# Patient Record
Sex: Female | Born: 1975 | Race: White | Hispanic: No | Marital: Married | Smoking: Current every day smoker
Health system: Southern US, Community
[De-identification: ages and names within clinical notes are randomized; demographics above are authoritative.]

## PROBLEM LIST (undated history)

## (undated) DIAGNOSIS — K219 Gastro-esophageal reflux disease without esophagitis: Secondary | ICD-10-CM

## (undated) DIAGNOSIS — E7800 Pure hypercholesterolemia, unspecified: Secondary | ICD-10-CM

## (undated) DIAGNOSIS — F4310 Post-traumatic stress disorder, unspecified: Secondary | ICD-10-CM

## (undated) DIAGNOSIS — E119 Type 2 diabetes mellitus without complications: Secondary | ICD-10-CM

## (undated) HISTORY — PX: TONSILLECTOMY: SUR1361

## (undated) HISTORY — PX: OTHER SURGICAL HISTORY: SHX169

## (undated) HISTORY — PX: ABDOMINAL HYSTERECTOMY: SHX81

## (undated) HISTORY — PX: CHOLECYSTECTOMY: SHX55

---

## 2009-12-15 ENCOUNTER — Emergency Department (HOSPITAL_BASED_OUTPATIENT_CLINIC_OR_DEPARTMENT_OTHER): Admission: EM | Admit: 2009-12-15 | Discharge: 2009-12-15 | Payer: Self-pay | Admitting: Emergency Medicine

## 2010-01-23 ENCOUNTER — Emergency Department (HOSPITAL_BASED_OUTPATIENT_CLINIC_OR_DEPARTMENT_OTHER): Admission: EM | Admit: 2010-01-23 | Discharge: 2010-01-23 | Payer: Self-pay | Admitting: Emergency Medicine

## 2011-02-16 LAB — URINE CULTURE: Colony Count: 40000

## 2011-02-16 LAB — URINALYSIS, ROUTINE W REFLEX MICROSCOPIC
Leukocytes, UA: NEGATIVE
Nitrite: NEGATIVE
Specific Gravity, Urine: 1.005 (ref 1.005–1.030)
Urobilinogen, UA: 0.2 mg/dL (ref 0.0–1.0)
pH: 6.5 (ref 5.0–8.0)

## 2011-02-16 LAB — URINE MICROSCOPIC-ADD ON

## 2012-08-12 ENCOUNTER — Emergency Department (HOSPITAL_BASED_OUTPATIENT_CLINIC_OR_DEPARTMENT_OTHER)
Admission: EM | Admit: 2012-08-12 | Discharge: 2012-08-12 | Disposition: A | Discharge status: Home / Self Care | Payer: Self-pay | Attending: Emergency Medicine | Admitting: Emergency Medicine

## 2012-08-12 ENCOUNTER — Encounter (HOSPITAL_BASED_OUTPATIENT_CLINIC_OR_DEPARTMENT_OTHER): Payer: Self-pay | Admitting: *Deleted

## 2012-08-12 DIAGNOSIS — M542 Cervicalgia: Secondary | ICD-10-CM | POA: Insufficient documentation

## 2012-08-12 DIAGNOSIS — R531 Weakness: Secondary | ICD-10-CM

## 2012-08-12 DIAGNOSIS — F4310 Post-traumatic stress disorder, unspecified: Secondary | ICD-10-CM | POA: Insufficient documentation

## 2012-08-12 DIAGNOSIS — K219 Gastro-esophageal reflux disease without esophagitis: Secondary | ICD-10-CM | POA: Insufficient documentation

## 2012-08-12 DIAGNOSIS — F17200 Nicotine dependence, unspecified, uncomplicated: Secondary | ICD-10-CM | POA: Insufficient documentation

## 2012-08-12 HISTORY — DX: Post-traumatic stress disorder, unspecified: F43.10

## 2012-08-12 HISTORY — DX: Gastro-esophageal reflux disease without esophagitis: K21.9

## 2012-08-12 LAB — CBC WITH DIFFERENTIAL/PLATELET
Basophils Absolute: 0 10*3/uL (ref 0.0–0.1)
Eosinophils Relative: 4 % (ref 0–5)
HCT: 40.3 % (ref 36.0–46.0)
Lymphocytes Relative: 41 % (ref 12–46)
Lymphs Abs: 4 10*3/uL (ref 0.7–4.0)
MCV: 96.4 fL (ref 78.0–100.0)
Neutro Abs: 4.3 10*3/uL (ref 1.7–7.7)
Platelets: 282 10*3/uL (ref 150–400)
RBC: 4.18 MIL/uL (ref 3.87–5.11)
RDW: 12.6 % (ref 11.5–15.5)
WBC: 9.8 10*3/uL (ref 4.0–10.5)

## 2012-08-12 LAB — COMPREHENSIVE METABOLIC PANEL
ALT: 47 U/L — ABNORMAL HIGH (ref 0–35)
AST: 43 U/L — ABNORMAL HIGH (ref 0–37)
Alkaline Phosphatase: 109 U/L (ref 39–117)
CO2: 24 mEq/L (ref 19–32)
Calcium: 9.5 mg/dL (ref 8.4–10.5)
Chloride: 103 mEq/L (ref 96–112)
GFR calc Af Amer: >90 mL/min (ref >90)
GFR calc non Af Amer: >90 mL/min (ref >90)
Glucose, Bld: 132 mg/dL — ABNORMAL HIGH (ref 70–99)
Sodium: 139 mEq/L (ref 135–145)
Total Bilirubin: 0.3 mg/dL (ref 0.3–1.2)

## 2012-08-12 NOTE — ED Provider Notes (Signed)
History     CSN: [id number]  Arrival date & time 08/12/12  2114   First MD Initiated Contact with Patient 08/12/12 2155      Chief Complaint  Patient presents with  . Neck Pain    (Consider location/radiation/quality/duration/timing/severity/associated sxs/prior treatment) HPI Comments: Patient presents here complaining of weakness, fatigue for the past week.  She reports that she feels a sensation of "blood rushing from the left side of her neck into her head".  She feels light-headed and weak and has no energy.  She denies fevers or chills.  Had hysterectomy when she was 27.    Patient is a 36 y.o. female presenting with neck pain. The history is provided by the patient.  Neck Pain  This is a new problem. Episode onset: one week ago. The problem occurs constantly. The problem has been gradually worsening. The pain is associated with nothing. There has been no fever. The pain is present in the left side.    Past Medical History  Diagnosis Date  . PTSD (post-traumatic stress disorder)   . GERD (gastroesophageal reflux disease)     Past Surgical History  Procedure Date  . Abdominal hysterectomy   . Cholecystectomy   . Tonsillectomy     No family history on file.  History  Substance Use Topics  . Smoking status: Current Every Day Smoker -- 1.0 packs/day  . Smokeless tobacco: Not on file  . Alcohol Use: No    OB History    Grav Para Term Preterm Abortions TAB SAB Ect Mult Living                  Review of Systems  Constitutional: Positive for fatigue. Negative for fever and chills.  HENT: Positive for neck pain.   All other systems reviewed and are negative.    Allergies  Keflex; Morphine and related; Penicillins; and Trazodone and nefazodone  Home Medications   Current Outpatient Rx  Name Route Sig Dispense Refill  . ACETAMINOPHEN 500 MG PO TABS Oral Take 1,500 mg by mouth once as needed. For pain    . ESCITALOPRAM OXALATE 20 MG PO TABS Oral Take 20 mg  by mouth daily.    Marland Kitchen OVER THE COUNTER MEDICATION Oral Take 1 tablet by mouth at bedtime. Heartburn medication      BP 124/83  Pulse 72  Temp 98.1 F (36.7 C) (Oral)  Resp 20  SpO2 100%  Physical Exam  Nursing note and vitals reviewed. Constitutional: She is oriented to person, place, and time. She appears well-developed and well-nourished. No distress.  HENT:  Head: Normocephalic and atraumatic.  Neck: Normal range of motion. Neck supple.       No bruits are audible.  Cardiovascular: Normal rate and regular rhythm.  Exam reveals no gallop and no friction rub.   No murmur heard. Pulmonary/Chest: Effort normal and breath sounds normal. No respiratory distress. She has no wheezes.  Abdominal: Soft. Bowel sounds are normal. She exhibits no distension. There is no tenderness.  Musculoskeletal: Normal range of motion.  Neurological: She is alert and oriented to person, place, and time.  Skin: Skin is warm and dry. She is not diaphoretic.    ED Course  Procedures (including critical care time)   Labs Reviewed  CBC WITH DIFFERENTIAL  COMPREHENSIVE METABOLIC PANEL   No results found.   No diagnosis found.    MDM  The patient presents here with a one week history of fatigue, having a sensation of blood  rushing up the left side of her neck into her head.  I am unable to find anything concerning on physical exam.  There are no bruits and neurologically, the exam is non-focal.  I doubt there is a stenosis or lesion in the carotid artery.  The cbc and cmp are unremarkable ruling out anemia or electrolyte abnormality.  I am unsure as to what the cause of her symptoms, however it does not appear to be emergent.  I believe she is stable for discharge, to give time, and follow up or return if her symptoms worsen.        Nfl./c  Geoffery Lyons, MD 08/12/12 [phone]

## 2012-08-12 NOTE — ED Notes (Signed)
Knot on her left neck for 3 days.  has a sensation of blood rushing to her head that makes her lightheaded when she moves her eyes. Has started exercising recently to decrease her cholesterol.

## 2013-05-08 ENCOUNTER — Encounter (HOSPITAL_BASED_OUTPATIENT_CLINIC_OR_DEPARTMENT_OTHER): Payer: Self-pay

## 2013-05-08 ENCOUNTER — Emergency Department (HOSPITAL_BASED_OUTPATIENT_CLINIC_OR_DEPARTMENT_OTHER)
Admission: EM | Admit: 2013-05-08 | Discharge: 2013-05-08 | Disposition: A | Discharge status: Home / Self Care | Payer: Medicaid Other | Attending: Emergency Medicine | Admitting: Emergency Medicine

## 2013-05-08 DIAGNOSIS — F17200 Nicotine dependence, unspecified, uncomplicated: Secondary | ICD-10-CM | POA: Insufficient documentation

## 2013-05-08 DIAGNOSIS — Z880 Allergy status to penicillin: Secondary | ICD-10-CM | POA: Insufficient documentation

## 2013-05-08 DIAGNOSIS — F4310 Post-traumatic stress disorder, unspecified: Secondary | ICD-10-CM | POA: Insufficient documentation

## 2013-05-08 DIAGNOSIS — E7800 Pure hypercholesterolemia, unspecified: Secondary | ICD-10-CM | POA: Insufficient documentation

## 2013-05-08 DIAGNOSIS — L600 Ingrowing nail: Secondary | ICD-10-CM

## 2013-05-08 DIAGNOSIS — Z79899 Other long term (current) drug therapy: Secondary | ICD-10-CM | POA: Insufficient documentation

## 2013-05-08 DIAGNOSIS — Z8719 Personal history of other diseases of the digestive system: Secondary | ICD-10-CM | POA: Insufficient documentation

## 2013-05-08 HISTORY — DX: Pure hypercholesterolemia, unspecified: E78.00

## 2013-05-08 MED ORDER — SULFAMETHOXAZOLE-TRIMETHOPRIM 800-160 MG PO TABS: 1.0000 | ORAL_TABLET | Freq: Two times a day (BID) | ORAL | Status: DC

## 2013-05-08 NOTE — Discharge Instructions (Signed)
Infected Ingrown Toenail  An infected ingrown toenail occurs when the nail edge grows into the skin and bacteria invade the area. Symptoms include pain, tenderness, swelling, and pus drainage from the edge of the nail. Poorly fitting shoes, minor injuries, and improper cutting of the toenail may also contribute to the problem. You should cut your toenails squarely instead of rounding the edges. Do not cut them too short. Avoid tight or pointed toe shoes. Sometimes the ingrown portion of the nail must be removed. If your toenail is removed, it can take 3-4 months for it to re-grow.  HOME CARE INSTRUCTIONS    Soak your infected toe in warm water for 20-30 minutes, 2 to 3 times a day.   Packing or dressings applied to the area should be changed daily.   Take medicine as directed and finish them.   Reduce activities and keep your foot elevated when able to reduce swelling and discomfort. Do this until the infection gets better.   Wear sandals or go barefoot as much as possible while the infected area is sensitive.   See your caregiver for follow-up care in 2-3 days if the infection is not better.  SEEK MEDICAL CARE IF:   Your toe is becoming more red, swollen or painful.  MAKE SURE YOU:    Understand these instructions.   Will watch your condition.   Will get help right away if you are not doing well or get worse.  Document Released: 12/25/2004 Document Revised: 02/09/2012 Document Reviewed: 11/13/2008  [employer] Patient Information 2014 [employer]

## 2013-05-08 NOTE — ED Notes (Signed)
Foot great toe pain with redness and edema no purulent drainage noted. Pt Reports pain 4 /10 that increase with ambulation or touch.

## 2013-05-08 NOTE — ED Provider Notes (Signed)
Medical screening examination/treatment/procedure(s) were performed by non-physician practitioner and as supervising physician I was immediately available for consultation/collaboration.  Jodine Muchmore J. Stehanie Ekstrom, MD 05/08/13 2252 

## 2013-05-08 NOTE — ED Notes (Signed)
Patient complains of pain to right great toe since Friday. Reports that she tried to remove ingrown nail and now red and swelling with pain to same, no drainage

## 2013-05-08 NOTE — ED Provider Notes (Signed)
History     CSN: [id number]  Arrival date & time 05/08/13  1857   First MD Initiated Contact with Patient 05/08/13 1945      Chief Complaint  Patient presents with  . Toe Pain    (Consider location/radiation/quality/duration/timing/severity/associated sxs/prior treatment) Patient is a 37 y.o. female presenting with toe pain. The history is provided by the patient. No language interpreter was used.  Toe Pain This is a new problem. The current episode started in the past 7 days. The problem occurs constantly. The problem has been unchanged. The symptoms are aggravated by walking. She has tried nothing for the symptoms. The treatment provided moderate relief.  Pt complains of an ingrown toenail .  Pt reports she tried to cut area out.   Pt reports it looks like nail is infected  Past Medical History  Diagnosis Date  . PTSD (post-traumatic stress disorder)   . GERD (gastroesophageal reflux disease)   . High cholesterol     Past Surgical History  Procedure Laterality Date  . Abdominal hysterectomy    . Cholecystectomy    . Tonsillectomy      No family history on file.  History  Substance Use Topics  . Smoking status: Current Every Day Smoker -- 1.00 packs/day  . Smokeless tobacco: Not on file  . Alcohol Use: No    OB History   Grav Para Term Preterm Abortions TAB SAB Ect Mult Living                  Review of Systems  Skin: Positive for wound.  All other systems reviewed and are negative.    Allergies  Keflex; Morphine and related; Penicillins; and Trazodone and nefazodone  Home Medications   Current Outpatient Rx  Name  Route  Sig  Dispense  Refill  . atorvastatin (LIPITOR) 20 MG tablet   Oral   Take 20 mg by mouth daily.         Marland Kitchen lamoTRIgine (LAMICTAL) 100 MG tablet   Oral   Take 100 mg by mouth daily.         Marland Kitchen acetaminophen (TYLENOL) 500 MG tablet   Oral   Take 1,500 mg by mouth once as needed. For pain         . escitalopram (LEXAPRO) 20  MG tablet   Oral   Take 10 mg by mouth daily.          Marland Kitchen OVER THE COUNTER MEDICATION   Oral   Take 1 tablet by mouth at bedtime. Heartburn medication           BP 132/85  Pulse 81  Temp(Src) 97.9 F (36.6 C) (Oral)  Resp 18  SpO2 100%  Physical Exam  Nursing note and vitals reviewed. Constitutional: She appears well-developed and well-nourished.  HENT:  Head: Normocephalic.  Musculoskeletal: She exhibits tenderness.  Swollen distal toe past nail,  Nail cut into lateral tissue  Neurological: She is alert.  Skin: Skin is warm.  Psychiatric: She has a normal mood and affect.    ED Course  Procedures (including critical care time)  Labs Reviewed - No data to display No results found.   1. Ingrown toenail       MDM  Pt given rx for bactrim.    I advised follow up with Dr. Charlsie Merles if not improving with antibiotic        Elson Areas, PA-C 05/08/13 2043

## 2013-11-03 ENCOUNTER — Emergency Department (HOSPITAL_BASED_OUTPATIENT_CLINIC_OR_DEPARTMENT_OTHER): Payer: Medicaid Other

## 2013-11-03 ENCOUNTER — Encounter (HOSPITAL_BASED_OUTPATIENT_CLINIC_OR_DEPARTMENT_OTHER): Payer: Self-pay | Admitting: Emergency Medicine

## 2013-11-03 ENCOUNTER — Emergency Department (HOSPITAL_BASED_OUTPATIENT_CLINIC_OR_DEPARTMENT_OTHER)
Admission: EM | Admit: 2013-11-03 | Discharge: 2013-11-04 | Disposition: A | Discharge status: Home / Self Care | Payer: Medicaid Other | Attending: Emergency Medicine | Admitting: Emergency Medicine

## 2013-11-03 DIAGNOSIS — R079 Chest pain, unspecified: Secondary | ICD-10-CM | POA: Insufficient documentation

## 2013-11-03 DIAGNOSIS — Z792 Long term (current) use of antibiotics: Secondary | ICD-10-CM | POA: Insufficient documentation

## 2013-11-03 DIAGNOSIS — Z8719 Personal history of other diseases of the digestive system: Secondary | ICD-10-CM | POA: Insufficient documentation

## 2013-11-03 DIAGNOSIS — E7800 Pure hypercholesterolemia, unspecified: Secondary | ICD-10-CM | POA: Insufficient documentation

## 2013-11-03 DIAGNOSIS — F4310 Post-traumatic stress disorder, unspecified: Secondary | ICD-10-CM | POA: Insufficient documentation

## 2013-11-03 DIAGNOSIS — Z79899 Other long term (current) drug therapy: Secondary | ICD-10-CM | POA: Insufficient documentation

## 2013-11-03 DIAGNOSIS — F17200 Nicotine dependence, unspecified, uncomplicated: Secondary | ICD-10-CM | POA: Insufficient documentation

## 2013-11-03 DIAGNOSIS — E119 Type 2 diabetes mellitus without complications: Secondary | ICD-10-CM | POA: Insufficient documentation

## 2013-11-03 DIAGNOSIS — R002 Palpitations: Secondary | ICD-10-CM | POA: Insufficient documentation

## 2013-11-03 DIAGNOSIS — Z880 Allergy status to penicillin: Secondary | ICD-10-CM | POA: Insufficient documentation

## 2013-11-03 HISTORY — DX: Type 2 diabetes mellitus without complications: E11.9

## 2013-11-03 LAB — BASIC METABOLIC PANEL
BUN: 6 mg/dL (ref 6–23)
Chloride: 101 mEq/L (ref 96–112)
GFR calc Af Amer: >90 mL/min (ref >90)
GFR calc non Af Amer: >90 mL/min (ref >90)
Potassium: 3.4 mEq/L — ABNORMAL LOW (ref 3.5–5.1)
Sodium: 136 mEq/L (ref 135–145)

## 2013-11-03 LAB — CBC WITH DIFFERENTIAL/PLATELET
Basophils Absolute: 0 10*3/uL (ref 0.0–0.1)
Basophils Relative: 0 % (ref 0–1)
Eosinophils Absolute: 0.2 10*3/uL (ref 0.0–0.7)
MCH: 33.3 pg (ref 26.0–34.0)
MCHC: 35 g/dL (ref 30.0–36.0)
Monocytes Relative: 8 % (ref 3–12)
Neutro Abs: 7.8 10*3/uL — ABNORMAL HIGH (ref 1.7–7.7)
Neutrophils Relative %: 63 % (ref 43–77)
Platelets: 271 10*3/uL (ref 150–400)
RDW: 12.6 % (ref 11.5–15.5)

## 2013-11-03 LAB — TROPONIN I: Troponin I: <0.3 ng/mL (ref <0.30)

## 2013-11-03 MED ORDER — SODIUM CHLORIDE 0.9 % IV BOLUS (SEPSIS)
1000.0000 mL | Freq: Once | INTRAVENOUS | Status: AC
  Administered 2013-11-04: 1000 mL via INTRAVENOUS

## 2013-11-03 MED ORDER — IOHEXOL 350 MG/ML SOLN
80.0000 mL | Freq: Once | INTRAVENOUS | Status: AC | PRN
  Administered 2013-11-03: 80 mL via INTRAVENOUS

## 2013-11-03 NOTE — ED Provider Notes (Signed)
CSN: [id number]     Arrival date & time 11/03/13  2015 History   First MD Initiated Contact with Patient 11/03/13 2053     Chief Complaint  Patient presents with  . Palpitations   (Consider location/radiation/quality/duration/timing/severity/associated sxs/prior Treatment) HPI Comments: Pt states that she feels like her heart is racing and she is sob:pt states that it came on acutely about 3 hours ago when sitting in class:pt states that she feels a little tightness with the racing, but no pain:denies fever, cough:pt states that she has no history of similar symptoms:pt states that she has had 6 episode of diarrhea today  The history is provided by the patient. No language interpreter was used.    Past Medical History  Diagnosis Date  . PTSD (post-traumatic stress disorder)   . GERD (gastroesophageal reflux disease)   . High cholesterol   . Diabetes mellitus without complication    Past Surgical History  Procedure Laterality Date  . Abdominal hysterectomy    . Cholecystectomy    . Tonsillectomy     No family history on file. History  Substance Use Topics  . Smoking status: Current Every Day Smoker -- 1.00 packs/day  . Smokeless tobacco: Not on file  . Alcohol Use: No   OB History   Grav Para Term Preterm Abortions TAB SAB Ect Mult Living                 Review of Systems  Constitutional: Negative.   Respiratory: Negative.   Cardiovascular: Negative.     Allergies  Keflex; Morphine and related; Penicillins; and Trazodone and nefazodone  Home Medications   Current Outpatient Rx  Name  Route  Sig  Dispense  Refill  . metFORMIN (GLUCOPHAGE) 500 MG tablet   Oral   Take by mouth 2 (two) times daily with a meal.         . acetaminophen (TYLENOL) 500 MG tablet   Oral   Take 1,500 mg by mouth once as needed. For pain         . atorvastatin (LIPITOR) 20 MG tablet   Oral   Take 20 mg by mouth daily.         Marland Kitchen escitalopram (LEXAPRO) 20 MG tablet   Oral    Take 10 mg by mouth daily.          Marland Kitchen lamoTRIgine (LAMICTAL) 100 MG tablet   Oral   Take 100 mg by mouth daily.         Marland Kitchen OVER THE COUNTER MEDICATION   Oral   Take 1 tablet by mouth at bedtime. Heartburn medication         . sulfamethoxazole-trimethoprim (SEPTRA DS) 800-160 MG per tablet   Oral   Take 1 tablet by mouth every 12 (twelve) hours.   20 tablet   0    BP 134/87  Pulse 110  Temp(Src) 99 F (37.2 C) (Oral)  Resp 22  Wt 216 lb (97.977 kg)  SpO2 100% Physical Exam  Nursing note and vitals reviewed. Constitutional: She is oriented to person, place, and time. She appears well-developed and well-nourished.  HENT:  Head: Normocephalic and atraumatic.  Cardiovascular: Normal rate and regular rhythm.   Pulmonary/Chest: Effort normal and breath sounds normal.  Abdominal: Soft. Bowel sounds are normal.  Musculoskeletal: Normal range of motion.  Neurological: She is alert and oriented to person, place, and time. No cranial nerve deficit.  Skin: Skin is warm and dry.  Psychiatric: She has a normal  mood and affect.    ED Course  Procedures (including critical care time) Labs Review Labs Reviewed  CBC WITH DIFFERENTIAL - Abnormal; Notable for the following:    WBC 12.5 (*)    Neutro Abs 7.8 (*)    All other components within normal limits  BASIC METABOLIC PANEL - Abnormal; Notable for the following:    Potassium 3.4 (*)    Glucose, Bld 209 (*)    All other components within normal limits  D-DIMER, QUANTITATIVE - Abnormal; Notable for the following:    D-Dimer, Quant 0.82 (*)    All other components within normal limits  TROPONIN I   Imaging Review Dg Chest 2 View  11/03/2013   CLINICAL DATA:  Palpitations and chest tightness  EXAM: CHEST  2 VIEW  COMPARISON:  January 02, 2010  FINDINGS: The heart size and mediastinal contours are within normal limits. Both lungs are clear. The visualized skeletal structures are unremarkable. Surgical clips are identified in  the right upper quadrant.  IMPRESSION: No active cardiopulmonary disease.   Electronically Signed   By: Sherian Rein M.D.   On: 11/03/2013 21:45   Ct Angio Chest Pe W/cm &/or Wo Cm  11/03/2013   CLINICAL DATA:  Chest pain.  Cough.  Tachycardia.  Elevated D-dimer.  EXAM: CT ANGIOGRAPHY CHEST WITH CONTRAST  TECHNIQUE: Multidetector CT imaging of the chest was performed using the standard protocol during bolus administration of intravenous contrast. Multiplanar CT image reconstructions including MIPs were obtained to evaluate the vascular anatomy.  CONTRAST:  80mL OMNIPAQUE IOHEXOL 350 MG/ML SOLN  COMPARISON:  Plain film earlier in the day.  No prior CT.  FINDINGS: Lung windows demonstrate mild motion degradation throughout. Exam also degraded by patient body habitus. No airspace disease identified.  Soft tissue windows: The quality of this exam for evaluation of pulmonary embolism is moderate to good. The bolus is well timed. No evidence of pulmonary embolism to the large segmental level. Smaller emboli cannot be excluded.  Normal aortic caliber without dissection. Heart size upper normal, without pericardial effusion. No pleural fluid. No mediastinal or hilar adenopathy.  Limited abdominal imaging demonstrates moderate hepatic steatosis. No acute osseous abnormality.  Review of the MIP images confirms the above findings.  IMPRESSION: 1. Degraded exam secondary to motion and patient body habitus. No pulmonary embolism to the large segmental level. 2. No other explanation for patient's symptoms. 3. Hepatic steatosis.   Electronically Signed   By: Jeronimo Greaves M.D.   On: 11/03/2013 22:30    EKG Interpretation   None       MDM   1. Palpitations   2. Chest pain    Pt left with Dr. Elesa Massed for ct follow up    Teressa Lower, NP 11/04/13 1208

## 2013-11-03 NOTE — ED Notes (Signed)
Patient transported to X-ray and ct ambulatory with tech. 

## 2013-11-03 NOTE — ED Notes (Signed)
States she is having palpitations and a panic like feeling. States she cannot get herself together. Started while she was in class.

## 2013-11-03 NOTE — ED Provider Notes (Signed)
Medical screening examination/treatment/procedure(s) were conducted as a shared visit with non-physician practitioner(s) and myself.  I personally evaluated the patient during the encounter.   Date: 11/03/2013  Rate: 103  Rhythm: sinus tachycardia  QRS Axis: normal  Intervals: normal  ST/T Wave abnormalities: normal  Conduction Disutrbances: none  Narrative Interpretation: unremarkable; nonspecific ST flattening in inferior leads, no old for comparison   Patient is a 37 year old female with a history of diabetes, hyperlipidemia, tobacco use, family history of CAD who presents the emergency department with an episode of palpitations, chest pressure and shortness of breath and dizziness that started at 6 PM tonight. Patient denies any prior similar symptoms. No prior history of PE or DVT but states her father does have a history of factor V Leiden deficiency. She has had diarrhea but no other recent fever, cough, vomiting. Labs show mild leukocytosis but negative troponin. D-dimer is slightly elevated. CT chest pending to rule out PE.   Patient reports feeling better without intervention. Her CT chest shows no pulmonary embolus, infiltrate or edema. She states her chest pressure is gone. We'll repeat second set of cardiac labs at 6 hours after onset of symptoms given patient has multiple risk factors for ACS. Patient is now stating that she believes she may have actually taken her daughter's Vyvanse with her medications today.  If workup is negative and patient is still feeling well, anticipate discharge home with close outpatient followup.  Signed out to Dr. Bernette Mayers to follow up on second troponin.     Layla Maw Carrisa Keller, DO 11/04/13 [phone]

## 2013-11-04 LAB — TROPONIN I: Troponin I: <0.3 ng/mL (ref <0.30)

## 2013-11-04 LAB — RAPID URINE DRUG SCREEN, HOSP PERFORMED
Amphetamines: POSITIVE — AB
Opiates: NOT DETECTED
Tetrahydrocannabinol: NOT DETECTED

## 2013-11-04 NOTE — ED Provider Notes (Signed)
Care assumed at the change of shift pending repeat Trop which is neg. Pt is feeling better. Some concern for inadvertently taking daughter's Vyvanse and UDS is positive for Amphetamines. Doubt this is PE although patient aware of suboptimal CTA. Advised PCP followup if symptoms persist.   Leonette Most B. Bernette Mayers, MD 11/04/13 [phone]

## 2014-06-29 ENCOUNTER — Encounter (HOSPITAL_BASED_OUTPATIENT_CLINIC_OR_DEPARTMENT_OTHER): Payer: Self-pay | Admitting: Emergency Medicine

## 2014-06-29 ENCOUNTER — Emergency Department (HOSPITAL_BASED_OUTPATIENT_CLINIC_OR_DEPARTMENT_OTHER): Payer: Medicaid Other

## 2014-06-29 ENCOUNTER — Emergency Department (HOSPITAL_BASED_OUTPATIENT_CLINIC_OR_DEPARTMENT_OTHER)
Admission: EM | Admit: 2014-06-29 | Discharge: 2014-06-29 | Disposition: A | Discharge status: Home / Self Care | Payer: Medicaid Other | Attending: Emergency Medicine | Admitting: Emergency Medicine

## 2014-06-29 DIAGNOSIS — F4310 Post-traumatic stress disorder, unspecified: Secondary | ICD-10-CM | POA: Insufficient documentation

## 2014-06-29 DIAGNOSIS — Z880 Allergy status to penicillin: Secondary | ICD-10-CM | POA: Diagnosis not present

## 2014-06-29 DIAGNOSIS — Z792 Long term (current) use of antibiotics: Secondary | ICD-10-CM | POA: Insufficient documentation

## 2014-06-29 DIAGNOSIS — F17200 Nicotine dependence, unspecified, uncomplicated: Secondary | ICD-10-CM | POA: Insufficient documentation

## 2014-06-29 DIAGNOSIS — J40 Bronchitis, not specified as acute or chronic: Secondary | ICD-10-CM | POA: Diagnosis not present

## 2014-06-29 DIAGNOSIS — E7800 Pure hypercholesterolemia, unspecified: Secondary | ICD-10-CM | POA: Diagnosis not present

## 2014-06-29 DIAGNOSIS — Z79899 Other long term (current) drug therapy: Secondary | ICD-10-CM | POA: Diagnosis not present

## 2014-06-29 DIAGNOSIS — E119 Type 2 diabetes mellitus without complications: Secondary | ICD-10-CM | POA: Diagnosis not present

## 2014-06-29 DIAGNOSIS — R05 Cough: Secondary | ICD-10-CM | POA: Insufficient documentation

## 2014-06-29 DIAGNOSIS — R059 Cough, unspecified: Secondary | ICD-10-CM | POA: Insufficient documentation

## 2014-06-29 DIAGNOSIS — Z8719 Personal history of other diseases of the digestive system: Secondary | ICD-10-CM | POA: Insufficient documentation

## 2014-06-29 DIAGNOSIS — J209 Acute bronchitis, unspecified: Secondary | ICD-10-CM

## 2014-06-29 LAB — CBG MONITORING, ED: Glucose-Capillary: 158 mg/dL — ABNORMAL HIGH (ref 70–99)

## 2014-06-29 MED ORDER — ALBUTEROL SULFATE (2.5 MG/3ML) 0.083% IN NEBU
5.0000 mg | INHALATION_SOLUTION | Freq: Once | RESPIRATORY_TRACT | Status: AC
  Administered 2014-06-29: 5 mg via RESPIRATORY_TRACT
  Filled 2014-06-29: qty 6

## 2014-06-29 MED ORDER — PREDNISONE 10 MG PO TABS: 20.0000 mg | ORAL_TABLET | Freq: Every day | ORAL | Status: AC

## 2014-06-29 MED ORDER — PREDNISONE 50 MG PO TABS
60.0000 mg | ORAL_TABLET | Freq: Once | ORAL | Status: AC
  Administered 2014-06-29: 60 mg via ORAL
  Filled 2014-06-29 (×2): qty 1

## 2014-06-29 MED ORDER — ALBUTEROL SULFATE HFA 108 (90 BASE) MCG/ACT IN AERS
2.0000 | INHALATION_SPRAY | RESPIRATORY_TRACT | Status: DC | PRN
  Administered 2014-06-29: 2 via RESPIRATORY_TRACT
  Filled 2014-06-29: qty 6.7

## 2014-06-29 MED ORDER — IPRATROPIUM BROMIDE 0.02 % IN SOLN
0.5000 mg | Freq: Once | RESPIRATORY_TRACT | Status: AC
  Administered 2014-06-29: 0.5 mg via RESPIRATORY_TRACT
  Filled 2014-06-29: qty 2.5

## 2014-06-29 NOTE — ED Provider Notes (Signed)
CSN: [id number]     Arrival date & time 06/29/14  2020 History  This chart was scribed for Hilario Quarry, MD by Chestine Spore, ED Scribe. The patient was seen in room MH06/MH06 at 9:38 PM.    Chief Complaint  Patient presents with  . Cough     Patient is a 38 y.o. female presenting with cough. The history is provided by the patient. No language interpreter was used.  Cough Cough characteristics:  Non-productive Severity:  Moderate Onset quality:  Sudden Duration:  3 days Timing:  Constant Progression:  Unchanged Chronicity:  New Smoker: yes   Relieved by:  None tried Worsened by:  Nothing tried Ineffective treatments:  None tried Associated symptoms: chills, diaphoresis, fever and shortness of breath      HPI Comments: Breanna Norton is a 38 y.o. female with a medical hx of DM who presents to the Emergency Department complaining of non-productive cough onset 4 days ago. She states that she had a fever, diaphoresis, and chills 4 days ago. She states that she began to feel better yesterday. She states that she is having associated symptoms of SOB. She denies any other associated symptoms. She states that she is a smoker and she smokes 1 Pack a day. She states that she tries to drink fluids like she should but she sometimes does not want too. She states that he had a hysterectomy. She states that she has taken Metformin for her DM. [facility] is where her PCP is located. She states that the last time she had a cigarette was on the way here and that she could not finish it.    Past Medical History  Diagnosis Date  . PTSD (post-traumatic stress disorder)   . GERD (gastroesophageal reflux disease)   . High cholesterol   . Diabetes mellitus without complication    Past Surgical History  Procedure Laterality Date  . Abdominal hysterectomy    . Cholecystectomy    . Tonsillectomy     No family history on file. History  Substance Use Topics  . Smoking status:  Current Every Day Smoker -- 1.00 packs/day  . Smokeless tobacco: Not on file  . Alcohol Use: No   OB History   Grav Para Term Preterm Abortions TAB SAB Ect Mult Living                 Review of Systems  Constitutional: Positive for fever, chills and diaphoresis.  Respiratory: Positive for cough and shortness of breath.   All other systems reviewed and are negative.     Allergies  Keflex; Morphine and related; Penicillins; and Trazodone and nefazodone  Home Medications   Prior to Admission medications   Medication Sig Start Date End Date Taking? Authorizing Provider  ezetimibe (ZETIA) 10 MG tablet Take 10 mg by mouth daily.   Yes Historical Provider, MD  acetaminophen (TYLENOL) 500 MG tablet Take 1,500 mg by mouth once as needed. For pain    Historical Provider, MD  atorvastatin (LIPITOR) 20 MG tablet Take 20 mg by mouth daily.    Historical Provider, MD  escitalopram (LEXAPRO) 20 MG tablet Take 10 mg by mouth daily.     Historical Provider, MD  lamoTRIgine (LAMICTAL) 100 MG tablet Take 100 mg by mouth daily.    Historical Provider, MD  metFORMIN (GLUCOPHAGE) 500 MG tablet Take by mouth 2 (two) times daily with a meal.    Historical Provider, MD  OVER THE COUNTER MEDICATION Take 1 tablet by  mouth at bedtime. Heartburn medication    Historical Provider, MD  sulfamethoxazole-trimethoprim (SEPTRA DS) 800-160 MG per tablet Take 1 tablet by mouth every 12 (twelve) hours. 05/08/13   Elson AreasLeslie K Sofia, PA-C   BP 123/98  Pulse 103  Temp(Src) 99.5 F (37.5 C) (Oral)  Resp 20  Ht 5' 5.5" (1.664 m)  Wt 232 lb (105.235 kg)  BMI 38.01 kg/m2  SpO2 97%  Physical Exam  Nursing note and vitals reviewed. Constitutional: She is oriented to person, place, and time. She appears well-developed and well-nourished.  HENT:  Head: Normocephalic and atraumatic.  Right Ear: Tympanic membrane and external ear normal.  Left Ear: Tympanic membrane and external ear normal.  Nose: Nose normal. Right sinus  exhibits no maxillary sinus tenderness and no frontal sinus tenderness. Left sinus exhibits no maxillary sinus tenderness and no frontal sinus tenderness.  Mouth/Throat: Oropharynx is clear and moist.  Eyes: Conjunctivae and EOM are normal. Pupils are equal, round, and reactive to light. Right eye exhibits no nystagmus. Left eye exhibits no nystagmus.  Neck: Normal range of motion. Neck supple. No JVD present. No tracheal deviation present. No thyromegaly present.  Cardiovascular: Normal rate, regular rhythm, normal heart sounds and intact distal pulses.   Pulmonary/Chest: Effort normal. No respiratory distress. She has no wheezes. She exhibits no tenderness.  Diffused rhonchi and coarse breath sounds diffusely.   Abdominal: Soft. Bowel sounds are normal. She exhibits no distension and no mass. There is no tenderness. There is no guarding.  Musculoskeletal: Normal range of motion. She exhibits no edema and no tenderness.  Lymphadenopathy:    She has no cervical adenopathy.  Neurological: She is alert and oriented to person, place, and time. She has normal strength and normal reflexes. No cranial nerve deficit or sensory deficit. She displays a negative Romberg sign. Gait normal. GCS eye subscore is 4. GCS verbal subscore is 5. GCS motor subscore is 6.  Reflex Scores:      Tricep reflexes are 2+ on the right side and 2+ on the left side.      Bicep reflexes are 2+ on the right side and 2+ on the left side.      Brachioradialis reflexes are 2+ on the right side and 2+ on the left side.      Patellar reflexes are 2+ on the right side and 2+ on the left side.      Achilles reflexes are 2+ on the right side and 2+ on the left side. Patient with normal gait without ataxia, shuffling, spasm, or antalgia. Speech is normal without dysarthria, dysphasia, or aphasia. Muscle strength is 5/5 in bilateral shoulders, elbow flexor and extensors, wrist flexor and extensors, and intrinsic hand muscles. 5/5  bilateral lower extremity hip flexors, extensors, knee flexors and extensors, and ankle dorsi and plantar flexors.    Skin: Skin is warm and dry. No rash noted.  Psychiatric: She has a normal mood and affect. Her behavior is normal. Judgment and thought content normal.    ED Course  Procedures (including critical care time) DIAGNOSTIC STUDIES: Oxygen Saturation is 97% on room air, normal by my interpretation.    COORDINATION OF CARE: 9:42 PM-Discussed treatment plan which includes CXR and EKG with pt at bedside and pt agreed to plan.   Labs Review Labs Reviewed  CBG MONITORING, ED - Abnormal; Notable for the following:    Glucose-Capillary 158 (*)    All other components within normal limits    Imaging Review Dg Chest 2  View  06/29/2014   CLINICAL DATA:  Shortness of breath and cough. Chest pain with cough.  EXAM: CHEST  2 VIEW  COMPARISON:  11/03/2013  FINDINGS: The heart size and mediastinal contours are within normal limits. Both lungs are clear. The visualized skeletal structures are unremarkable.  IMPRESSION: No active cardiopulmonary disease.   Electronically Signed   By: Burman Nieves M.D.   On: 06/29/2014 21:27     EKG Interpretation None      MDM   Final diagnoses:  Bronchitis with bronchospasm  Smoker   38 y.o. Female smoker with uri symptoms now with ongoing cough.  Patient treated with albuterol/atrovent and prednisone.  She is given inhaler here.  Respiratory status appears stable.   I personally performed the services described in this documentation, which was scribed in my presence. The recorded information has been reviewed and is accurate.     Hilario Quarry, MD 06/29/14 [phone]

## 2014-06-29 NOTE — ED Notes (Signed)
C/o cough, fever body aches onset Sunday,   Now main complaint is cough, rib and chest soreness

## 2014-06-29 NOTE — Discharge Instructions (Signed)
Acute Bronchitis Bronchitis is when the airways that extend from the windpipe into the lungs get red, puffy, and painful (inflamed). Bronchitis often causes thick spit (mucus) to develop. This leads to a cough. A cough is the most common symptom of bronchitis. In acute bronchitis, the condition usually begins suddenly and goes away over time (usually in 2 weeks). Smoking, allergies, and asthma can make bronchitis worse. Repeated episodes of bronchitis may cause more lung problems. HOME CARE  Rest.  Drink enough fluids to keep your pee (urine) clear or pale yellow (unless you need to limit fluids as told by your doctor).  Only take over-the-counter or prescription medicines as told by your doctor.  Avoid smoking and secondhand smoke. These can make bronchitis worse. If you are a smoker, think about using nicotine gum or skin patches. Quitting smoking will help your lungs heal faster.  Reduce the chance of getting bronchitis again by:  Washing your hands often.  Avoiding people with cold symptoms.  Trying not to touch your hands to your mouth, nose, or eyes.  Follow up with your doctor as told. GET HELP IF: Your symptoms do not improve after 1 week of treatment. Symptoms include:  Cough.  Fever.  Coughing up thick spit.  Body aches.  Chest congestion.  Chills.  Shortness of breath.  Sore throat. GET HELP RIGHT AWAY IF:   You have an increased fever.  You have chills.  You have severe shortness of breath.  You have bloody thick spit (sputum).  You throw up (vomit) often.  You lose too much body fluid (dehydration).  You have a severe headache.  You faint. MAKE SURE YOU:   Understand these instructions.  Will watch your condition.  Will get help right away if you are not doing well or get worse. Document Released: 05/05/2008 Document Revised: 07/20/2013 Document Reviewed: 05/10/2013 [facility] Patient Information 2015 [place], [place]. This information is not  intended to replace advice given to you by your health care provider. Make sure you discuss any questions you have with your health care provider. You Can Quit Smoking If you are ready to quit smoking or are thinking about it, congratulations! You have chosen to help yourself be healthier and live longer! There are lots of different ways to quit smoking. Nicotine gum, nicotine patches, a nicotine inhaler, or nicotine nasal spray can help with physical craving. Hypnosis, support groups, and medicines help break the habit of smoking. TIPS TO GET OFF AND STAY OFF CIGARETTES  Learn to predict your moods. Do not let a bad situation be your excuse to have a cigarette. Some situations in your life might tempt you to have a cigarette.  Ask friends and co-workers not to smoke around you.  Make your home smoke-free.  Never have "just one" cigarette. It leads to wanting another and another. Remind yourself of your decision to quit.  On a card, make a list of your reasons for not smoking. Read it at least the same number of times a day as you have a cigarette. Tell yourself everyday, "I do not want to smoke. I choose not to smoke."  Ask someone at home or work to help you with your plan to quit smoking.  Have something planned after you eat or have a cup of coffee. Take a walk or get other exercise to perk you up. This will help to keep you from overeating.  Try a relaxation exercise to calm you down and decrease your stress. Remember, you may  be tense and nervous the first two weeks after you quit. This will pass.  Find new activities to keep your hands busy. Play with a pen, coin, or rubber band. Doodle or draw things on paper.  Brush your teeth right after eating. This will help cut down the craving for the taste of tobacco after meals. You can try mouthwash too.  Try gum, breath mints, or diet candy to keep something in your mouth. IF YOU SMOKE AND WANT TO QUIT:  Do not stock up on cigarettes.  Never buy a carton. Wait until one pack is finished before you buy another.  Never carry cigarettes with you at work or at home.  Keep cigarettes as far away from you as possible. Leave them with someone else.  Never carry matches or a lighter with you.  Ask yourself, "Do I need this cigarette or is this just a reflex?"  Bet with someone that you can quit. Put cigarette money in a piggy bank every morning. If you smoke, you give up the money. If you do not smoke, by the end of the week, you keep the money.  Keep trying. It takes 21 days to change a habit!  Talk to your doctor about using medicines to help you quit. These include nicotine replacement gum, lozenges, or skin patches. Document Released: 09/13/2009 Document Revised: 02/09/2012 Document Reviewed: 09/13/2009 [facility] Patient Information 2015 [place], [place]. This information is not intended to replace advice given to you by your health care provider. Make sure you discuss any questions you have with your health care provider.

## 2014-06-29 NOTE — ED Notes (Addendum)
Dry cough x 4 days. Fever and chills. Soreness in her chest.

## 2015-05-09 ENCOUNTER — Emergency Department (HOSPITAL_BASED_OUTPATIENT_CLINIC_OR_DEPARTMENT_OTHER)
Admission: EM | Admit: 2015-05-09 | Discharge: 2015-05-09 | Disposition: A | Discharge status: Home / Self Care | Payer: Managed Care, Other (non HMO) | Attending: Emergency Medicine | Admitting: Emergency Medicine

## 2015-05-09 ENCOUNTER — Encounter (HOSPITAL_BASED_OUTPATIENT_CLINIC_OR_DEPARTMENT_OTHER): Payer: Self-pay | Admitting: *Deleted

## 2015-05-09 DIAGNOSIS — E119 Type 2 diabetes mellitus without complications: Secondary | ICD-10-CM | POA: Diagnosis not present

## 2015-05-09 DIAGNOSIS — L0291 Cutaneous abscess, unspecified: Secondary | ICD-10-CM

## 2015-05-09 DIAGNOSIS — Z8719 Personal history of other diseases of the digestive system: Secondary | ICD-10-CM | POA: Diagnosis not present

## 2015-05-09 DIAGNOSIS — Z79899 Other long term (current) drug therapy: Secondary | ICD-10-CM | POA: Insufficient documentation

## 2015-05-09 DIAGNOSIS — E780 Pure hypercholesterolemia: Secondary | ICD-10-CM | POA: Insufficient documentation

## 2015-05-09 DIAGNOSIS — F4310 Post-traumatic stress disorder, unspecified: Secondary | ICD-10-CM | POA: Diagnosis not present

## 2015-05-09 DIAGNOSIS — L02211 Cutaneous abscess of abdominal wall: Secondary | ICD-10-CM | POA: Diagnosis not present

## 2015-05-09 DIAGNOSIS — Z720 Tobacco use: Secondary | ICD-10-CM | POA: Insufficient documentation

## 2015-05-09 DIAGNOSIS — L02411 Cutaneous abscess of right axilla: Secondary | ICD-10-CM | POA: Insufficient documentation

## 2015-05-09 DIAGNOSIS — Z880 Allergy status to penicillin: Secondary | ICD-10-CM | POA: Insufficient documentation

## 2015-05-09 MED ORDER — DOXYCYCLINE HYCLATE 100 MG PO TABS: 100.0000 mg | ORAL_TABLET | Freq: Two times a day (BID) | ORAL | Status: DC

## 2015-05-09 MED ORDER — OXYCODONE-ACETAMINOPHEN 5-325 MG PO TABS
2.0000 | ORAL_TABLET | Freq: Once | ORAL | Status: AC
  Administered 2015-05-09: 2 via ORAL
  Filled 2015-05-09: qty 2

## 2015-05-09 MED ORDER — LIDOCAINE-EPINEPHRINE 2 %-1:100000 IJ SOLN
20.0000 mL | Freq: Once | INTRAMUSCULAR | Status: DC
  Filled 2015-05-09: qty 1

## 2015-05-09 NOTE — ED Provider Notes (Signed)
CSN: [id number]     Arrival date & time 05/09/15  1954 History   First MD Initiated Contact with Patient 05/09/15 2053     Chief Complaint  Patient presents with  . Abscess     (Consider location/radiation/quality/duration/timing/severity/associated sxs/prior Treatment) Patient is a 39 y.o. female presenting with abscess. The history is provided by the patient.  Abscess Abscess quality: induration   Red streaking: no   Progression:  Worsening Chronicity:  Recurrent Associated symptoms: no fever, no nausea and no vomiting      Breanna Norton is a 39 yo F p/w with an abscess. She noticed that her right axilla was becoming painful. She has had several abscesses drained in that area. Usually they rupture on their own but this one is continuing. She also has a spot on her left flank that is causing her pain.  She has tried different medications at home but no relief.  She denies any fever, nausea, vomiting, diarrhea, constipation, chest pain or shortness of breath.   Past Medical History  Diagnosis Date  . PTSD (post-traumatic stress disorder)   . GERD (gastroesophageal reflux disease)   . High cholesterol   . Diabetes mellitus without complication    Past Surgical History  Procedure Laterality Date  . Abdominal hysterectomy    . Cholecystectomy    . Tonsillectomy    . Oopharectomy     No family history on file. History  Substance Use Topics  . Smoking status: Current Every Day Smoker -- 1.00 packs/day  . Smokeless tobacco: Not on file  . Alcohol Use: No   OB History    No data available     Review of Systems  Constitutional: Negative for fever and chills.  Respiratory: Negative for shortness of breath.   Cardiovascular: Negative for chest pain.  Gastrointestinal: Negative for nausea, vomiting, diarrhea and constipation.  Genitourinary: Negative for dysuria.  Skin: Positive for rash.  Neurological: Negative for weakness and numbness.      Allergies  Keflex;  Morphine and related; Penicillins; and Trazodone and nefazodone  Home Medications   Prior to Admission medications   Medication Sig Start Date End Date Taking? Authorizing Provider  acetaminophen (TYLENOL) 500 MG tablet Take 1,500 mg by mouth once as needed. For pain    Historical Provider, MD  atorvastatin (LIPITOR) 20 MG tablet Take 20 mg by mouth daily.    Historical Provider, MD  doxycycline (VIBRA-TABS) 100 MG tablet Take 1 tablet (100 mg total) by mouth 2 (two) times daily. 05/09/15   Myra Rude, MD  escitalopram (LEXAPRO) 20 MG tablet Take 10 mg by mouth daily.     Historical Provider, MD  ezetimibe (ZETIA) 10 MG tablet Take 10 mg by mouth daily.    Historical Provider, MD  lamoTRIgine (LAMICTAL) 100 MG tablet Take 100 mg by mouth daily.    Historical Provider, MD  metFORMIN (GLUCOPHAGE) 500 MG tablet Take by mouth 2 (two) times daily with a meal.    Historical Provider, MD  OVER THE COUNTER MEDICATION Take 1 tablet by mouth at bedtime. Heartburn medication    Historical Provider, MD  sulfamethoxazole-trimethoprim (SEPTRA DS) 800-160 MG per tablet Take 1 tablet by mouth every 12 (twelve) hours. 05/08/13   Elson Areas, PA-C   BP 137/91 mmHg  Pulse 106  Temp(Src) 98.5 F (36.9 C) (Oral)  Resp 22  Wt 232 lb (105.235 kg)  SpO2 96% Physical Exam  Constitutional: She is oriented to person, place, and time. She appears  well-developed and well-nourished.  HENT:  Head: Normocephalic and atraumatic.  Eyes: Conjunctivae and EOM are normal.  Neck: Normal range of motion. Neck supple.  Cardiovascular: Normal rate, regular rhythm and normal heart sounds.   No murmur heard. Pulmonary/Chest: Effort normal and breath sounds normal.  Abdominal: Soft. Bowel sounds are normal. There is no tenderness. There is no rebound.  Musculoskeletal: Normal range of motion.  Neurological: She is oriented to person, place, and time.  Skin: Rash noted.     Right axilla: area of fluctuance and  induration. No streaking. Area is TTP, no active draining, Several other scars from previous I&D  Left flank: area of small fluctuance and induration. Area of erythema and TTP,    Incision and Drainage Procedure Note: Right axilla   The affected area was cleaned and draped in a sterile fashion. Anesthesia was achieved using 3 mL of 1% Lidocaine with epinephrine injected around the wound area using a 25-guage 1.5 inch needle. An 11-blade scalpel was used to incise the wound. A hemostat was used to break any loculations that were present. Iodoform guaze was used to pack the wound. The patient tolerated the procedure well. No complications were encountered.   Incision and Drainage Procedure Note: Left flank   The affected area was cleaned and draped in a sterile fashion. Anesthesia was achieved using 3 mL of 1% Lidocaine with epinephrine injected around the wound area using a 25-guage 1.5 inch needle. An 11-blade scalpel was used to incise the wound. A hemostat was used to break any loculations that were present. Iodoform guaze was used to pack the wound. The patient tolerated the procedure well. No complications were encountered.   ED Course  Procedures (including critical care time) Labs Review Labs Reviewed - No data to display  Imaging Review No results found.   EKG Interpretation None     Medications  lidocaine-EPINEPHrine (XYLOCAINE W/EPI) 2 %-1:100000 (with pres) injection 20 mL (not administered)  oxyCODONE-acetaminophen (PERCOCET/ROXICET) 5-325 MG per tablet 2 tablet (2 tablets Oral Given 05/09/15 2127)    MDM   Final diagnoses:  Abscess    Breanna Norton presenting with two abscesses. One under her right axilla and the other on her left flank. These were I&D'd and she tolerated the procedure well. She will be given doxycycline 100 mg BID for 10 days. Instructed to f/u with her primary doctor for ongoing care.  She is agreeable with plan and discharge.   Myra RudeJeremy E Schmitz,  MD PGY-2, [facility] 05/09/2015, 11:14 PM      Myra RudeJeremy E Schmitz, MD 05/09/15 [id number]  Rolland PorterMark James, MD 05/18/15 [phone]

## 2015-05-09 NOTE — ED Notes (Signed)
Abscess to left side and one under rt arm x 5 days

## 2015-05-09 NOTE — Discharge Instructions (Signed)
Abscess °An abscess (boil or furuncle) is an infected area on or under the skin. This area is filled with yellowish-white fluid (pus) and other material (debris). °HOME CARE  °· Only take medicines as told by your doctor. °· If you were given antibiotic medicine, take it as directed. Finish the medicine even if you start to feel better. °· If gauze is used, follow your doctor's directions for changing the gauze. °· To avoid spreading the infection: °¨ Keep your abscess covered with a bandage. °¨ Wash your hands well. °¨ Do not share personal care items, towels, or whirlpools with others. °¨ Avoid skin contact with others. °· Keep your skin and clothes clean around the abscess. °· Keep all doctor visits as told. °GET HELP RIGHT AWAY IF:  °· You have more pain, puffiness (swelling), or redness in the wound site. °· You have more fluid or blood coming from the wound site. °· You have muscle aches, chills, or you feel sick. °· You have a fever. °MAKE SURE YOU:  °· Understand these instructions. °· Will watch your condition. °· Will get help right away if you are not doing well or get worse. °Document Released: 05/05/2008 Document Revised: 05/18/2012 Document Reviewed: 01/30/2012 °ExitCare® Patient Information ©2015 [employer], [employer]. This information is not intended to replace advice given to you by your health care provider. Make sure you discuss any questions you have with your health care provider. ° °

## 2015-05-09 NOTE — ED Notes (Signed)
Pt c/o right axilla abscess and left side abscess x5 days. Pt deniedrainage.

## 2015-08-21 ENCOUNTER — Encounter (HOSPITAL_BASED_OUTPATIENT_CLINIC_OR_DEPARTMENT_OTHER): Payer: Self-pay

## 2015-08-21 ENCOUNTER — Emergency Department (HOSPITAL_BASED_OUTPATIENT_CLINIC_OR_DEPARTMENT_OTHER)
Admission: EM | Admit: 2015-08-21 | Discharge: 2015-08-21 | Disposition: A | Discharge status: Home / Self Care | Payer: BLUE CROSS/BLUE SHIELD | Attending: Emergency Medicine | Admitting: Emergency Medicine

## 2015-08-21 DIAGNOSIS — L02411 Cutaneous abscess of right axilla: Secondary | ICD-10-CM | POA: Diagnosis present

## 2015-08-21 DIAGNOSIS — L0390 Cellulitis, unspecified: Secondary | ICD-10-CM

## 2015-08-21 DIAGNOSIS — Z720 Tobacco use: Secondary | ICD-10-CM | POA: Diagnosis not present

## 2015-08-21 DIAGNOSIS — Z8719 Personal history of other diseases of the digestive system: Secondary | ICD-10-CM | POA: Diagnosis not present

## 2015-08-21 DIAGNOSIS — L03111 Cellulitis of right axilla: Secondary | ICD-10-CM | POA: Diagnosis not present

## 2015-08-21 DIAGNOSIS — L0291 Cutaneous abscess, unspecified: Secondary | ICD-10-CM

## 2015-08-21 DIAGNOSIS — Z880 Allergy status to penicillin: Secondary | ICD-10-CM | POA: Diagnosis not present

## 2015-08-21 DIAGNOSIS — E119 Type 2 diabetes mellitus without complications: Secondary | ICD-10-CM | POA: Diagnosis not present

## 2015-08-21 DIAGNOSIS — E780 Pure hypercholesterolemia: Secondary | ICD-10-CM | POA: Diagnosis not present

## 2015-08-21 DIAGNOSIS — Z8659 Personal history of other mental and behavioral disorders: Secondary | ICD-10-CM | POA: Diagnosis not present

## 2015-08-21 DIAGNOSIS — Z79899 Other long term (current) drug therapy: Secondary | ICD-10-CM | POA: Diagnosis not present

## 2015-08-21 MED ORDER — ACIDOPHILUS PROBIOTIC 10 MG PO TABS: 10.0000 mg | ORAL_TABLET | Freq: Three times a day (TID) | ORAL | Status: DC

## 2015-08-21 MED ORDER — DOXYCYCLINE HYCLATE 100 MG PO CAPS: 100.0000 mg | ORAL_CAPSULE | Freq: Two times a day (BID) | ORAL | Status: DC

## 2015-08-21 MED ORDER — LIDOCAINE-EPINEPHRINE (PF) 2 %-1:200000 IJ SOLN
10.0000 mL | Freq: Once | INTRAMUSCULAR | Status: AC
  Administered 2015-08-21: 10 mL
  Filled 2015-08-21: qty 10

## 2015-08-21 NOTE — ED Notes (Signed)
Abscess to right UE x 1 week-pt states she is now having n/v

## 2015-08-21 NOTE — Discharge Instructions (Signed)
Abscess °Care After °An abscess (also called a boil or furuncle) is an infected area that contains a collection of pus. Signs and symptoms of an abscess include pain, tenderness, redness, or hardness, or you may feel a moveable soft area under your skin. An abscess can occur anywhere in the body. The infection may spread to surrounding tissues causing cellulitis. A cut (incision) by the surgeon was made over your abscess and the pus was drained out. Gauze may have been packed into the space to provide a drain that will allow the cavity to heal from the inside outwards. The boil may be painful for 5 to 7 days. Most people with a boil do not have high fevers. Your abscess, if seen early, may not have localized, and may not have been lanced. If not, another appointment may be required for this if it does not get better on its own or with medications. °HOME CARE INSTRUCTIONS  °· Only take over-the-counter or prescription medicines for pain, discomfort, or fever as directed by your caregiver. °· When you bathe, soak and then remove gauze or iodoform packs at least daily or as directed by your caregiver. You may then wash the wound gently with mild soapy water. Repack with gauze or do as your caregiver directs. °SEEK IMMEDIATE MEDICAL CARE IF:  °· You develop increased pain, swelling, redness, drainage, or bleeding in the wound site. °· You develop signs of generalized infection including muscle aches, chills, fever, or a general ill feeling. °· An oral temperature above 102° F (38.9° C) develops, not controlled by medication. °See your caregiver for a recheck if you develop any of the symptoms described above. If medications (antibiotics) were prescribed, take them as directed. °Document Released: 06/05/2005 Document Revised: 02/09/2012 Document Reviewed: 01/31/2008 °[employer]® Patient Information ©2015 [employer]. This information is not intended to replace advice given to you by your health care provider. Make sure  you discuss any questions you have with your health care provider. ° °Cellulitis °Cellulitis is an infection of the skin and the tissue beneath it. The infected area is usually red and tender. Cellulitis occurs most often in the arms and lower legs.  °CAUSES  °Cellulitis is caused by bacteria that enter the skin through cracks or cuts in the skin. The most common types of bacteria that cause cellulitis are staphylococci and streptococci. °SIGNS AND SYMPTOMS  °· Redness and warmth. °· Swelling. °· Tenderness or pain. °· Fever. °DIAGNOSIS  °Your health care provider can usually determine what is wrong based on a physical exam. Blood tests may also be done. °TREATMENT  °Treatment usually involves taking an antibiotic medicine. °HOME CARE INSTRUCTIONS  °· Take your antibiotic medicine as directed by your health care provider. Finish the antibiotic even if you start to feel better. °· Keep the infected arm or leg elevated to reduce swelling. °· Apply a warm cloth to the affected area up to 4 times per day to relieve pain. °· Take medicines only as directed by your health care provider. °· Keep all follow-up visits as directed by your health care provider. °SEEK MEDICAL CARE IF:  °· You notice red streaks coming from the infected area. °· Your red area gets larger or turns dark in color. °· Your bone or joint underneath the infected area becomes painful after the skin has healed. °· Your infection returns in the same area or another area. °· You notice a swollen bump in the infected area. °· You develop new symptoms. °· You have   a fever. SEEK IMMEDIATE MEDICAL CARE IF:   You feel very sleepy.  You develop vomiting or diarrhea.  You have a general ill feeling (malaise) with muscle aches and pains. MAKE SURE YOU:   Understand these instructions.  Will watch your condition.  Will get help right away if you are not doing well or get worse. Document Released: 08/27/2005 Document Revised: 04/03/2014 Document  Reviewed: 02/02/2012 [facility] Patient Information 2015 [place], [place]. This information is not intended to replace advice given to you by your health care provider. Make sure you discuss any questions you have with your health care provider. Hidradenitis Suppurativa, Sweat Gland Abscess Hidradenitis suppurativa is a long lasting (chronic), uncommon disease of the sweat glands. With this, boil-like lumps and scarring develop in the groin, some times under the arms (axillae), and under the breasts. It may also uncommonly occur behind the ears, in the crease of the buttocks, and around the genitals.  CAUSES  The cause is from a blocking of the sweat glands. They then become infected. It may cause drainage and odor. It is not contagious. So it cannot be given to someone else. It most often shows up in puberty (about 49 to 37 years of age). But it may happen much later. It is similar to acne which is a disease of the sweat glands. This condition is slightly more common in African-Americans and women. SYMPTOMS   Hidradenitis usually starts as one or more red, tender, swellings in the groin or under the arms (axilla).  Over a period of hours to days the lesions get larger. They often open to the skin surface, draining clear to yellow-colored fluid.  The infected area heals with scarring. DIAGNOSIS  Your caregiver makes this diagnosis by looking at you. Sometimes cultures (growing germs on plates in the lab) may be taken. This is to see what germ (bacterium) is causing the infection.  TREATMENT   Topical germ killing medicine applied to the skin (antibiotics) are the treatment of choice. Antibiotics taken by mouth (systemic) are sometimes needed when the condition is getting worse or is severe.  Avoid tight-fitting clothing which traps moisture in.  Dirt does not cause hidradenitis and it is not caused by poor hygiene.  Involved areas should be cleaned daily using an antibacterial soap. Some patients  find that the liquid form of Lever 2000, applied to the involved areas as a lotion after bathing, can help reduce the odor related to this condition.  Sometimes surgery is needed to drain infected areas or remove scarred tissue. Removal of large amounts of tissue is used only in severe cases.  Birth control pills may be helpful.  Oral retinoids (vitamin A derivatives) for 6 to 12 months which are effective for acne may also help this condition.  Weight loss will improve but not cure hidradenitis. It is made worse by being overweight. But the condition is not caused by being overweight.  This condition is more common in people who have had acne.  It may become worse under stress. There is no medical cure for hidradenitis. It can be controlled, but not cured. The condition usually continues for years with periods of getting worse and getting better (remission). Document Released: 07/01/2004 Document Revised: 02/09/2012 Document Reviewed: 02/17/2014 [facility] Patient Information 2015 [place], [place]. This information is not intended to replace advice given to you by your health care provider. Make sure you discuss any questions you have with your health care provider.

## 2015-08-21 NOTE — ED Provider Notes (Signed)
CSN: [id number]     Arrival date & time 08/21/15  1202 History   First MD Initiated Contact with Patient 08/21/15 1215     Chief Complaint  Patient presents with  . Abscess   Breanna Norton is a 39 y.o. female with a history of hidradenitis suppurativa, and diabetes who presents to the ED complaining of a draining abscess to her right axilla for the past week. She reports it began to drain yesterday. Patient reports she has a history of frequent abscesses. The patient complains of 5 out of 10 pain there. She reports she had some nausea last night due to the pain. She denies current nausea or vomiting. The patient reports she is to see a dermatologist on October 3 for further management of her hidradenitis suppurativa. The patient denies fevers, vomiting, abdominal pain, numbness, tingling or weakness.  (Consider location/radiation/quality/duration/timing/severity/associated sxs/prior Treatment) HPI  Past Medical History  Diagnosis Date  . PTSD (post-traumatic stress disorder)   . GERD (gastroesophageal reflux disease)   . High cholesterol   . Diabetes mellitus without complication    Past Surgical History  Procedure Laterality Date  . Abdominal hysterectomy    . Cholecystectomy    . Tonsillectomy    . Oopharectomy     No family history on file. Social History  Substance Use Topics  . Smoking status: Current Every Day Smoker -- 1.00 packs/day  . Smokeless tobacco: None  . Alcohol Use: No   OB History    No data available     Review of Systems  Constitutional: Negative for fever and chills.  Respiratory: Negative for cough and shortness of breath.   Cardiovascular: Negative for chest pain.  Gastrointestinal: Positive for nausea. Negative for vomiting and abdominal pain.  Skin: Positive for color change.  Neurological: Negative for weakness and numbness.      Allergies  Keflex; Morphine and related; Penicillins; and Trazodone and nefazodone  Home Medications   Prior  to Admission medications   Medication Sig Start Date End Date Taking? Authorizing Provider  escitalopram (LEXAPRO) 20 MG tablet Take 20 mg by mouth daily.   Yes Historical Provider, MD  GLIPIZIDE PO Take by mouth.   Yes Historical Provider, MD  SITagliptin Phosphate (JANUVIA PO) Take by mouth.   Yes Historical Provider, MD  doxycycline (VIBRAMYCIN) 100 MG capsule Take 1 capsule (100 mg total) by mouth 2 (two) times daily. 08/21/15   Everlene Farrier, PA-C  ezetimibe (ZETIA) 10 MG tablet Take 10 mg by mouth daily.    Historical Provider, MD  Lactobacillus (ACIDOPHILUS PROBIOTIC) 10 MG TABS Take 10 mg by mouth 3 (three) times daily. 08/21/15   Everlene Farrier, PA-C  OVER THE COUNTER MEDICATION Take 1 tablet by mouth at bedtime. Heartburn medication    Historical Provider, MD   BP 112/76 mmHg  Pulse 86  Temp(Src) 97.9 F (36.6 C) (Oral)  Resp 18  Ht  (1.651 m)  Wt 220 lb (99.791 kg)  BMI 36.61 kg/m2  SpO2 98% Physical Exam  Constitutional: She appears well-developed and well-nourished. No distress.  Nontoxic appearing.  HENT:  Head: Normocephalic and atraumatic.  Eyes: Right eye exhibits no discharge. Left eye exhibits no discharge.  Cardiovascular: Normal rate, regular rhythm, normal heart sounds and intact distal pulses.   Bilateral radial pulses are intact.  Pulmonary/Chest: Effort normal. No respiratory distress.  Abdominal: Soft. There is no tenderness.  Neurological: She is alert. Coordination normal.  Skin: Skin is warm and dry. No rash noted. She  is not diaphoretic. There is erythema. No pallor.  1.5 cm fluctuant and indurated abscess to her right axilla with overlying erythema. No surrounding erythema. No discharge noted.  Multiple scars noted from previous abscesses.   Psychiatric: She has a normal mood and affect. Her behavior is normal.  Nursing note and vitals reviewed.   ED Course  INCISION AND DRAINAGE Date/Time: 08/21/2015 1:00 PM Performed by: Everlene Farrier Authorized by: Everlene Farrier Consent: Verbal consent obtained. Risks and benefits: risks, benefits and alternatives were discussed Consent given by: patient Patient understanding: patient states understanding of the procedure being performed Patient consent: the patient's understanding of the procedure matches consent given Procedure consent: procedure consent matches procedure scheduled Relevant documents: relevant documents present and verified Site marked: the operative site was marked Required items: required blood products, implants, devices, and special equipment available Patient identity confirmed: verbally with patient Time out: Immediately prior to procedure a "time out" was called to verify the correct patient, procedure, equipment, support staff and site/side marked as required. Type: abscess Location: Right axilla  Anesthesia: local infiltration Local anesthetic: lidocaine 2% with epinephrine Anesthetic total: 1 ml Patient sedated: no Scalpel size: 11 Incision type: single straight Incision depth: subcutaneous Complexity: complex Drainage: purulent Drainage amount: copious Wound treatment: wound left open Packing material: none Patient tolerance: Patient tolerated the procedure well with no immediate complications   (including critical care time) Labs Review Labs Reviewed - No data to display  Imaging Review No results found.    EKG Interpretation None      Filed Vitals:   08/21/15 1214  BP: 112/76  Pulse: 86  Temp: 97.9 F (36.6 C)  TempSrc: Oral  Resp: 18  Height:  (1.651 m)  Weight: 220 lb (99.791 kg)  SpO2: 98%     MDM   Meds given in ED:  Medications  lidocaine-EPINEPHrine (XYLOCAINE W/EPI) 2 %-1:200000 (PF) injection 10 mL (10 mLs Infiltration Given 08/21/15 1243)    New Prescriptions   DOXYCYCLINE (VIBRAMYCIN) 100 MG CAPSULE    Take 1 capsule (100 mg total) by mouth 2 (two) times daily.   LACTOBACILLUS (ACIDOPHILUS  PROBIOTIC) 10 MG TABS    Take 10 mg by mouth 3 (three) times daily.    Final diagnoses:  Abscess and cellulitis   This is a 39 y.o. female with a history of hidradenitis suppurativa, and diabetes who presents to the ED complaining of a draining abscess to her right axilla for the past week. She reports it began to drain yesterday. On exam patient is afebrile nontoxic appearing. The patient has a 1.5 cm indurated and fluctuant abscess to her right axilla. There is overlying erythema without surrounding erythema. Incision and drainage performed by me and tolerated well by the patient. A copious amount of purulent drainage was obtained. We will provide the patient with a prescription for doxycycline and have her follow-up with her dermatologist and primary care provider this week. Wound care instructions provided and strict return precautions provided. I advised the patient to follow-up with their primary care provider this week. I advised the patient to return to the emergency department with new or worsening symptoms or new concerns. The patient verbalized understanding and agreement with plan.      Everlene Farrier, PA-C 08/21/15 1316  Benjiman Core, MD 08/21/15 [phone]

## 2016-03-29 ENCOUNTER — Emergency Department (HOSPITAL_BASED_OUTPATIENT_CLINIC_OR_DEPARTMENT_OTHER)
Admission: EM | Admit: 2016-03-29 | Discharge: 2016-03-29 | Disposition: A | Discharge status: Home / Self Care | Payer: Medicaid Other | Attending: Dermatology | Admitting: Dermatology

## 2016-03-29 ENCOUNTER — Encounter (HOSPITAL_BASED_OUTPATIENT_CLINIC_OR_DEPARTMENT_OTHER): Payer: Self-pay | Admitting: *Deleted

## 2016-03-29 DIAGNOSIS — N644 Mastodynia: Secondary | ICD-10-CM | POA: Insufficient documentation

## 2016-03-29 DIAGNOSIS — Z5321 Procedure and treatment not carried out due to patient leaving prior to being seen by health care provider: Secondary | ICD-10-CM | POA: Insufficient documentation

## 2016-03-29 DIAGNOSIS — F17200 Nicotine dependence, unspecified, uncomplicated: Secondary | ICD-10-CM | POA: Diagnosis not present

## 2016-03-29 LAB — CBG MONITORING, ED: Glucose-Capillary: 362 mg/dL — ABNORMAL HIGH (ref 65–99)

## 2016-03-29 NOTE — ED Notes (Signed)
Pt informed registration staff she was leaving. Registration made aware a room was available for her at this time. Registration stated pt did not want to wait.

## 2016-03-29 NOTE — ED Notes (Signed)
Reports right breast pain with discharge from nipple x 1.5 weeks

## 2018-04-13 ENCOUNTER — Encounter (HOSPITAL_BASED_OUTPATIENT_CLINIC_OR_DEPARTMENT_OTHER): Payer: Self-pay | Admitting: *Deleted

## 2018-04-13 ENCOUNTER — Emergency Department (HOSPITAL_BASED_OUTPATIENT_CLINIC_OR_DEPARTMENT_OTHER)
Admission: EM | Admit: 2018-04-13 | Discharge: 2018-04-13 | Disposition: A | Discharge status: Home / Self Care | Payer: Medicaid Other | Attending: Emergency Medicine | Admitting: Emergency Medicine

## 2018-04-13 ENCOUNTER — Emergency Department (HOSPITAL_BASED_OUTPATIENT_CLINIC_OR_DEPARTMENT_OTHER): Payer: Medicaid Other

## 2018-04-13 ENCOUNTER — Other Ambulatory Visit: Payer: Self-pay

## 2018-04-13 DIAGNOSIS — E119 Type 2 diabetes mellitus without complications: Secondary | ICD-10-CM | POA: Insufficient documentation

## 2018-04-13 DIAGNOSIS — R197 Diarrhea, unspecified: Secondary | ICD-10-CM

## 2018-04-13 DIAGNOSIS — B370 Candidal stomatitis: Secondary | ICD-10-CM | POA: Diagnosis not present

## 2018-04-13 DIAGNOSIS — Z79899 Other long term (current) drug therapy: Secondary | ICD-10-CM | POA: Diagnosis not present

## 2018-04-13 DIAGNOSIS — R1110 Vomiting, unspecified: Secondary | ICD-10-CM | POA: Diagnosis present

## 2018-04-13 DIAGNOSIS — Z7984 Long term (current) use of oral hypoglycemic drugs: Secondary | ICD-10-CM | POA: Insufficient documentation

## 2018-04-13 DIAGNOSIS — B029 Zoster without complications: Secondary | ICD-10-CM | POA: Insufficient documentation

## 2018-04-13 DIAGNOSIS — K2900 Acute gastritis without bleeding: Secondary | ICD-10-CM | POA: Diagnosis not present

## 2018-04-13 DIAGNOSIS — F17200 Nicotine dependence, unspecified, uncomplicated: Secondary | ICD-10-CM | POA: Diagnosis not present

## 2018-04-13 LAB — LIPASE, BLOOD: Lipase: 23 U/L (ref 11–51)

## 2018-04-13 LAB — URINALYSIS, ROUTINE W REFLEX MICROSCOPIC
Bilirubin Urine: NEGATIVE
Glucose, UA: >=500 mg/dL — AB
KETONES UR: NEGATIVE mg/dL
Leukocytes, UA: NEGATIVE
Nitrite: NEGATIVE
Protein, ur: NEGATIVE mg/dL
Specific Gravity, Urine: 1.01 (ref 1.005–1.030)
pH: 6 (ref 5.0–8.0)

## 2018-04-13 LAB — COMPREHENSIVE METABOLIC PANEL
ALT: 66 U/L — ABNORMAL HIGH (ref 14–54)
ANION GAP: 11 (ref 5–15)
AST: 94 U/L — AB (ref 15–41)
Albumin: 3.7 g/dL (ref 3.5–5.0)
Alkaline Phosphatase: 107 U/L (ref 38–126)
BUN: 7 mg/dL (ref 6–20)
CHLORIDE: 97 mmol/L — AB (ref 101–111)
CO2: 22 mmol/L (ref 22–32)
Calcium: 8.8 mg/dL — ABNORMAL LOW (ref 8.9–10.3)
Creatinine, Ser: 0.56 mg/dL (ref 0.44–1.00)
GFR calc Af Amer: >60 mL/min (ref >60)
Glucose, Bld: 255 mg/dL — ABNORMAL HIGH (ref 65–99)
Potassium: 3.4 mmol/L — ABNORMAL LOW (ref 3.5–5.1)
Sodium: 130 mmol/L — ABNORMAL LOW (ref 135–145)
Total Bilirubin: 0.7 mg/dL (ref 0.3–1.2)
Total Protein: 7.7 g/dL (ref 6.5–8.1)

## 2018-04-13 LAB — CBC WITH DIFFERENTIAL/PLATELET
BASOS ABS: 0 10*3/uL (ref 0.0–0.1)
Basophils Relative: 0 %
Eosinophils Absolute: 0.2 10*3/uL (ref 0.0–0.7)
Eosinophils Relative: 2 %
HCT: 46.1 % — ABNORMAL HIGH (ref 36.0–46.0)
Hemoglobin: 16.8 g/dL — ABNORMAL HIGH (ref 12.0–15.0)
LYMPHS PCT: 26 %
Lymphs Abs: 2.4 10*3/uL (ref 0.7–4.0)
MCH: 33.5 pg (ref 26.0–34.0)
MCHC: 36.4 g/dL — ABNORMAL HIGH (ref 30.0–36.0)
MCV: 92 fL (ref 78.0–100.0)
Monocytes Absolute: 0.9 10*3/uL (ref 0.1–1.0)
Monocytes Relative: 10 %
Neutro Abs: 5.8 10*3/uL (ref 1.7–7.7)
Neutrophils Relative %: 62 %
Platelets: 246 10*3/uL (ref 150–400)
RBC: 5.01 MIL/uL (ref 3.87–5.11)
RDW: 12.7 % (ref 11.5–15.5)
WBC: 9.3 10*3/uL (ref 4.0–10.5)

## 2018-04-13 LAB — URINALYSIS, MICROSCOPIC (REFLEX)

## 2018-04-13 MED ORDER — ONDANSETRON 4 MG PO TBDP: ORAL_TABLET | ORAL | 0 refills | Status: DC

## 2018-04-13 MED ORDER — NYSTATIN 100000 UNIT/ML MT SUSP: 500000.0000 [IU] | Freq: Four times a day (QID) | OROMUCOSAL | 0 refills | Status: DC

## 2018-04-13 MED ORDER — PANTOPRAZOLE SODIUM 20 MG PO TBEC: 20.0000 mg | DELAYED_RELEASE_TABLET | Freq: Every day | ORAL | 0 refills | Status: DC

## 2018-04-13 MED ORDER — IOPAMIDOL (ISOVUE-300) INJECTION 61%
100.0000 mL | Freq: Once | INTRAVENOUS | Status: AC | PRN
  Administered 2018-04-13: 100 mL via INTRAVENOUS

## 2018-04-13 MED ORDER — GI COCKTAIL ~~LOC~~
30.0000 mL | Freq: Once | ORAL | Status: AC
  Administered 2018-04-13: 30 mL via ORAL
  Filled 2018-04-13: qty 30

## 2018-04-13 MED ORDER — AZITHROMYCIN 250 MG PO TABS: 500.0000 mg | ORAL_TABLET | Freq: Every day | ORAL | 0 refills | Status: AC

## 2018-04-13 MED ORDER — SODIUM CHLORIDE 0.9 % IV BOLUS
1000.0000 mL | Freq: Once | INTRAVENOUS | Status: AC
  Administered 2018-04-13: 1000 mL via INTRAVENOUS

## 2018-04-13 NOTE — ED Triage Notes (Signed)
Pt c/o bil flank pain x 3 weeks

## 2018-04-13 NOTE — ED Provider Notes (Signed)
[facility] EMERGENCY DEPARTMENT Provider Note   CSN: [id number] Arrival date & time: 04/13/18  1742     History   Chief Complaint Chief Complaint  Patient presents with  . Flank Pain    HPI Breanna Norton is a 42 y.o. female.  HPI Patient presents with abdominal pain, bilateral flank pain for the past 3 weeks.  States she has been [place] and developed the symptoms.  She was having multiple episodes of nonbloody non-bilious emesis starting 2 weeks ago.  She developed watery diarrhea over the last few days.  No blood in the stool.  No fever chills.  Patient states about 3 weeks ago she developed a burning rash to her left flank and left abdomen.  She had decreased urination but denies dysuria, hematuria.  Patient states also over the last few days she is developed white coating of her tongue and sore throat.  She is been taking ibuprofen frequently for her flank pain. Past Medical History:  Diagnosis Date  . Diabetes mellitus without complication (HCC)   . GERD (gastroesophageal reflux disease)   . High cholesterol   . PTSD (post-traumatic stress disorder)     There are no active problems to display for this patient.   Past Surgical History:  Procedure Laterality Date  . ABDOMINAL HYSTERECTOMY    . CHOLECYSTECTOMY    . oopharectomy    . TONSILLECTOMY       OB History   None      Home Medications    Prior to Admission medications   Medication Sig Start Date End Date Taking? Authorizing Provider  azithromycin (ZITHROMAX) 250 MG tablet Take 2 tablets (500 mg total) by mouth daily for 3 days. Take first 2 tablets together, then 1 every day until finished. 04/13/18 04/16/18  Loren Racer, MD  doxycycline (VIBRAMYCIN) 100 MG capsule Take 1 capsule (100 mg total) by mouth 2 (two) times daily. 08/21/15   Everlene Farrier, PA-C  escitalopram (LEXAPRO) 20 MG tablet Take 20 mg by mouth daily.    [provider]  ezetimibe (ZETIA) 10 MG tablet Take 10 mg by  mouth daily.    [provider]  GLIPIZIDE PO Take by mouth.    [provider]  Lactobacillus (ACIDOPHILUS PROBIOTIC) 10 MG TABS Take 10 mg by mouth 3 (three) times daily. 08/21/15   Everlene Farrier, PA-C  metFORMIN (GLUCOPHAGE) 1000 MG tablet Take 1,000 mg by mouth 2 (two) times daily with a meal.    [provider]  nystatin (MYCOSTATIN) 100000 UNIT/ML suspension Take 5 mLs (500,000 Units total) by mouth 4 (four) times daily. Swish and swallow 04/13/18   Loren Racer, MD  ondansetron (ZOFRAN ODT) 4 MG disintegrating tablet  ODT q4 hours prn nausea/vomit 04/13/18   Loren Racer, MD  OVER THE COUNTER MEDICATION Take 1 tablet by mouth at bedtime. Heartburn medication    [provider]  pantoprazole (PROTONIX) 20 MG tablet Take 1 tablet (20 mg total) by mouth daily. 04/13/18   Loren Racer, MD  SITagliptin Phosphate (JANUVIA PO) Take by mouth.    [provider]    Family History History reviewed. No pertinent family history.  Social History Social History   Tobacco Use  . Smoking status: Current Every Day Smoker    Packs/day: 1.00  . Smokeless tobacco: Never Used  Substance Use Topics  . Alcohol use: No  . Drug use: No     Allergies   Keflex [cephalexin]; Morphine and related; Penicillins; and Trazodone  and nefazodone   Review of Systems Review of Systems  Constitutional: Positive for fatigue. Negative for chills and fever.  HENT: Positive for sore throat. Negative for voice change.   Eyes: Negative for visual disturbance.  Respiratory: Negative for cough and shortness of breath.   Cardiovascular: Negative for chest pain.  Gastrointestinal: Positive for abdominal pain, diarrhea, nausea and vomiting. Negative for blood in stool.  Genitourinary: Positive for flank pain. Negative for dysuria, frequency, hematuria and pelvic pain.  Musculoskeletal: Positive for back pain and myalgias. Negative for neck pain and neck  stiffness.  Skin: Positive for rash. Negative for wound.  Neurological: Negative for dizziness, weakness, light-headedness, numbness and headaches.  All other systems reviewed and are negative.    Physical Exam Updated Vital Signs BP 100/68 (BP Location: Left Arm)   Pulse 94   Temp 98.3 F (36.8 C) (Oral)   Resp 16   Ht 5' 5.5" (1.664 m)   Wt 89.8 kg (198 lb)   SpO2 100%   BMI 32.45 kg/m   Physical Exam  Constitutional: She is oriented to person, place, and time. She appears well-developed and well-nourished. No distress.  HENT:  Head: Normocephalic and atraumatic.  White coating of the tongue with bilateral tonsillar hypertrophy and erythema.  No tonsillar exudates.  Eyes: Pupils are equal, round, and reactive to light. Conjunctivae and EOM are normal.  Neck: Normal range of motion. Neck supple.  Cardiovascular: Normal rate and regular rhythm. Exam reveals no gallop and no friction rub.  No murmur heard. Pulmonary/Chest: Effort normal and breath sounds normal. No stridor. No respiratory distress. She has no wheezes. She has no rales. She exhibits no tenderness.  Abdominal: Soft. Bowel sounds are normal. There is tenderness. There is no rebound and no guarding.  Patient with epigastric tenderness to palpation.  Mild left lower quadrant tenderness.  No rebound or guarding.  Musculoskeletal: Normal range of motion. She exhibits no edema or tenderness.  No CVA tenderness bilaterally.  No midline thoracic or lumbar tenderness.  Lymphadenopathy:    She has no cervical adenopathy.  Neurological: She is alert and oriented to person, place, and time.  Moves all extremities without focal deficit.  Sensation fully intact.  Skin: Skin is warm and dry. Rash noted. She is not diaphoretic. No erythema.  Patient with scabbed over erythematous maculopapular rash extending from the midline of the left flank region wrapping around to the midline of the abdomen.  No evidence of secondary  infection.  Psychiatric: She has a normal mood and affect. Her behavior is normal.  Nursing note and vitals reviewed.    ED Treatments / Results  Labs (all labs ordered are listed, but only abnormal results are displayed) Labs Reviewed  URINALYSIS, ROUTINE W REFLEX MICROSCOPIC - Abnormal; Notable for the following components:      Result Value   Glucose, UA >=500 (*)    Hgb urine dipstick TRACE (*)    All other components within normal limits  CBC WITH DIFFERENTIAL/PLATELET - Abnormal; Notable for the following components:   Hemoglobin 16.8 (*)    HCT 46.1 (*)    MCHC 36.4 (*)    All other components within normal limits  COMPREHENSIVE METABOLIC PANEL - Abnormal; Notable for the following components:   Sodium 130 (*)    Potassium 3.4 (*)    Chloride 97 (*)    Glucose, Bld 255 (*)    Calcium 8.8 (*)    AST 94 (*)    ALT 66 (*)  All other components within normal limits  URINALYSIS, MICROSCOPIC (REFLEX) - Abnormal; Notable for the following components:   Bacteria, UA FEW (*)    All other components within normal limits  LIPASE, BLOOD    EKG None  Radiology Ct Abdomen Pelvis W Contrast  Result Date: 04/13/2018 CLINICAL DATA:  Generalized abdominal pain, left-sided pain EXAM: CT ABDOMEN AND PELVIS WITH CONTRAST TECHNIQUE: Multidetector CT imaging of the abdomen and pelvis was performed using the standard protocol following bolus administration of intravenous contrast. CONTRAST:  ISOVUE-300 IOPAMIDOL (ISOVUE-300) INJECTION 61% COMPARISON:  None. FINDINGS: Lower chest: Lung bases clear Hepatobiliary: Fatty infiltration of liver without focal liver lesion. Cholecystectomy. Bile ducts nondilated Pancreas: Negative Spleen: Negative Adrenals/Urinary Tract: Normal kidneys. No renal obstruction or mass. Tiny nonobstructing renal calculi bilaterally. Normal bladder. Stomach/Bowel: Negative for bowel obstruction. No bowel mass or edema. Normal appendix. Vascular/Lymphatic: Negative  Reproductive: Cystic lesions in the uterus most compatible with fibroids with cystic degeneration, measuring 2.5 cm in the right fundus and 3 cm left fundus. Other: Negative for free fluid.  Negative for mass or adenopathy Musculoskeletal: No acute abnormality. Disc degeneration and spurring at L5-S1. IMPRESSION: 1. No cause for acute abdominal pain is identified. 2. Normal appendix 3. Fatty infiltration of liver 4. Tiny nonobstructing renal calculi bilaterally 5. Cystic lesions the uterus compatible with fibroids Electronically Signed   By: Marlan Palau M.D.   On: 04/13/2018 19:35    Procedures Procedures (including critical care time)  Medications Ordered in ED Medications  sodium chloride 0.9 % bolus 1,000 mL (0 mLs Intravenous Stopped 04/13/18 1911)  iopamidol (ISOVUE-300) 61 % injection 100 mL (100 mLs Intravenous Contrast Given 04/13/18 1913)  gi cocktail (Maalox,Lidocaine,Donnatal) (30 mLs Oral Given 04/13/18 2014)  sodium chloride 0.9 % bolus 1,000 mL (0 mLs Intravenous Stopped 04/13/18 2111)     Initial Impression / Assessment and Plan / ED Course  I have reviewed the triage vital signs and the nursing notes.  Pertinent labs & imaging results that were available during my care of the patient were reviewed by me and considered in my medical decision making (see chart for details).     CT without acute findings.  Patient states she is feeling better after medication and IV fluids.  Does appear to have thrush.  Will start on oral nystatin.  Given recent foreign travel and diarrhea will treat for traveler's diarrhea with azithromycin.  Rash is consistent with shingles.  She is outside the window for antivirals.  Advised to follow-up closely with her primary physician and return precautions have been given.  Final Clinical Impressions(s) / ED Diagnoses   Final diagnoses:  Vomiting and diarrhea  Acute superficial gastritis without hemorrhage  Herpes zoster without complication  Thrush      ED Discharge Orders        Ordered    pantoprazole (PROTONIX) 20 MG tablet  Daily     04/13/18 2208    ondansetron (ZOFRAN ODT) 4 MG disintegrating tablet     04/13/18 2208    nystatin (MYCOSTATIN) 100000 UNIT/ML suspension  4 times daily     04/13/18 2208    azithromycin (ZITHROMAX) 250 MG tablet  Daily     04/13/18 2208       Loren Racer, MD 04/13/18 2209

## 2018-04-13 NOTE — ED Notes (Signed)
Alert, NAD, calm, interactive, resps e/u, speaking in clear complete sentences, no dyspnea noted, skin W&D, recent VSS, reports pain improved, some soreness in back remains, (denies: sob, nausea, numbness, tingling, dizziness or visual changes). IVF complete.

## 2018-12-20 ENCOUNTER — Other Ambulatory Visit: Payer: Self-pay

## 2018-12-20 ENCOUNTER — Emergency Department (HOSPITAL_BASED_OUTPATIENT_CLINIC_OR_DEPARTMENT_OTHER)
Admission: EM | Admit: 2018-12-20 | Discharge: 2018-12-21 | Disposition: A | Discharge status: Home / Self Care | Payer: Medicaid Other | Attending: Emergency Medicine | Admitting: Emergency Medicine

## 2018-12-20 ENCOUNTER — Emergency Department (HOSPITAL_BASED_OUTPATIENT_CLINIC_OR_DEPARTMENT_OTHER): Payer: Medicaid Other

## 2018-12-20 ENCOUNTER — Encounter (HOSPITAL_BASED_OUTPATIENT_CLINIC_OR_DEPARTMENT_OTHER): Payer: Self-pay | Admitting: Emergency Medicine

## 2018-12-20 DIAGNOSIS — Z9049 Acquired absence of other specified parts of digestive tract: Secondary | ICD-10-CM | POA: Insufficient documentation

## 2018-12-20 DIAGNOSIS — Z79899 Other long term (current) drug therapy: Secondary | ICD-10-CM | POA: Insufficient documentation

## 2018-12-20 DIAGNOSIS — R072 Precordial pain: Secondary | ICD-10-CM | POA: Insufficient documentation

## 2018-12-20 DIAGNOSIS — E119 Type 2 diabetes mellitus without complications: Secondary | ICD-10-CM | POA: Insufficient documentation

## 2018-12-20 DIAGNOSIS — F17200 Nicotine dependence, unspecified, uncomplicated: Secondary | ICD-10-CM | POA: Insufficient documentation

## 2018-12-20 DIAGNOSIS — Z7984 Long term (current) use of oral hypoglycemic drugs: Secondary | ICD-10-CM | POA: Insufficient documentation

## 2018-12-20 MED ORDER — SODIUM CHLORIDE 0.9% FLUSH
3.0000 mL | Freq: Once | INTRAVENOUS | Status: DC
  Filled 2018-12-20: qty 3

## 2018-12-20 NOTE — ED Triage Notes (Signed)
Pt c/o 5/10 left cp radiating to her left shoulder that started today while at work.

## 2018-12-21 ENCOUNTER — Encounter (HOSPITAL_BASED_OUTPATIENT_CLINIC_OR_DEPARTMENT_OTHER): Payer: Self-pay | Admitting: Emergency Medicine

## 2018-12-21 LAB — CBC
HCT: 45.6 % (ref 36.0–46.0)
Hemoglobin: 15.2 g/dL — ABNORMAL HIGH (ref 12.0–15.0)
MCH: 31.9 pg (ref 26.0–34.0)
MCHC: 33.3 g/dL (ref 30.0–36.0)
MCV: 95.8 fL (ref 80.0–100.0)
Platelets: 258 10*3/uL (ref 150–400)
RBC: 4.76 MIL/uL (ref 3.87–5.11)
RDW: 12.2 % (ref 11.5–15.5)
WBC: 14.4 10*3/uL — ABNORMAL HIGH (ref 4.0–10.5)
nRBC: 0 % (ref 0.0–0.2)

## 2018-12-21 LAB — BASIC METABOLIC PANEL
Anion gap: 10 (ref 5–15)
BUN: 11 mg/dL (ref 6–20)
CO2: 21 mmol/L — ABNORMAL LOW (ref 22–32)
Calcium: 8.7 mg/dL — ABNORMAL LOW (ref 8.9–10.3)
Chloride: 100 mmol/L (ref 98–111)
Creatinine, Ser: 0.47 mg/dL (ref 0.44–1.00)
GFR calc Af Amer: >60 mL/min (ref >60)
GFR calc non Af Amer: >60 mL/min (ref >60)
Glucose, Bld: 248 mg/dL — ABNORMAL HIGH (ref 70–99)
POTASSIUM: 3.2 mmol/L — AB (ref 3.5–5.1)
Sodium: 131 mmol/L — ABNORMAL LOW (ref 135–145)

## 2018-12-21 LAB — TROPONIN I
Troponin I: <0.03 ng/mL (ref <0.03)
Troponin I: <0.03 ng/mL (ref <0.03)

## 2018-12-21 LAB — PREGNANCY, URINE: Preg Test, Ur: NEGATIVE

## 2018-12-21 NOTE — ED Provider Notes (Signed)
[facility] EMERGENCY DEPARTMENT Provider Note   CSN: [id number] Arrival date & time: 12/20/18  2335     History   Chief Complaint Chief Complaint  Patient presents with  . Chest Pain    HPI Breanna Norton is a 43 y.o. female.  The history is provided by the patient.  Chest Pain  Pain location:  Substernal area Pain quality: dull   Pain radiates to:  Does not radiate Pain severity:  Moderate Onset quality:  Sudden Timing:  Intermittent Progression:  Resolved Chronicity:  New Context: not breathing, not lifting and not trauma   Relieved by:  Nothing Worsened by:  Nothing Ineffective treatments:  None tried Associated symptoms: no altered mental status, no anxiety, no back pain, no claudication, no cough, no diaphoresis, no dizziness, no dysphagia, no fatigue, no nausea, no orthopnea, no palpitations, no shortness of breath, no syncope, no vomiting and no weakness   Risk factors: diabetes mellitus   States symptoms started at work and were coming and going even while lying in the bed in the ED but have resolved.  No belching.  No DOE.  No SOB.  No n/v/d.  No leg pain no long car trips, no OCP.    Past Medical History:  Diagnosis Date  . Diabetes mellitus without complication (HCC)   . GERD (gastroesophageal reflux disease)   . High cholesterol   . PTSD (post-traumatic stress disorder)     There are no active problems to display for this patient.   Past Surgical History:  Procedure Laterality Date  . ABDOMINAL HYSTERECTOMY    . CHOLECYSTECTOMY    . oopharectomy    . TONSILLECTOMY       OB History   No obstetric history on file.      Home Medications    Prior to Admission medications   Medication Sig Start Date End Date Taking? Authorizing Provider  doxycycline (VIBRAMYCIN) 100 MG capsule Take 1 capsule (100 mg total) by mouth 2 (two) times daily. 08/21/15   Everlene Farrier, PA-C  escitalopram (LEXAPRO) 20 MG tablet Take 20 mg by mouth daily.     [provider]  ezetimibe (ZETIA) 10 MG tablet Take 10 mg by mouth daily.    [provider]  GLIPIZIDE PO Take by mouth.    [provider]  Lactobacillus (ACIDOPHILUS PROBIOTIC) 10 MG TABS Take 10 mg by mouth 3 (three) times daily. 08/21/15   Everlene Farrier, PA-C  metFORMIN (GLUCOPHAGE) 1000 MG tablet Take 1,000 mg by mouth 2 (two) times daily with a meal.    [provider]  nystatin (MYCOSTATIN) 100000 UNIT/ML suspension Take 5 mLs (500,000 Units total) by mouth 4 (four) times daily. Swish and swallow 04/13/18   Loren Racer, MD  ondansetron (ZOFRAN ODT) 4 MG disintegrating tablet 4mg  ODT q4 hours prn nausea/vomit 04/13/18   Loren Racer, MD  OVER THE COUNTER MEDICATION Take 1 tablet by mouth at bedtime. Heartburn medication    [provider]  pantoprazole (PROTONIX) 20 MG tablet Take 1 tablet (20 mg total) by mouth daily. 04/13/18   Loren Racer, MD  SITagliptin Phosphate (JANUVIA PO) Take by mouth.    [provider]    Family History No family history on file.  Social History Social History   Tobacco Use  . Smoking status: Current Every Day Smoker    Packs/day: 1.00  . Smokeless tobacco: Never Used  Substance Use Topics  . Alcohol use: No  . Drug use: No  Allergies   Keflex [cephalexin]; Morphine and related; Penicillins; and Trazodone and nefazodone   Review of Systems Review of Systems  Constitutional: Negative for diaphoresis and fatigue.  HENT: Negative for trouble swallowing.   Respiratory: Negative for cough and shortness of breath.   Cardiovascular: Positive for chest pain. Negative for palpitations, orthopnea, claudication, leg swelling and syncope.  Gastrointestinal: Negative for nausea and vomiting.  Musculoskeletal: Negative for back pain and neck pain.  Neurological: Negative for dizziness and weakness.  All other systems reviewed and are negative.    Physical Exam Updated Vital  Signs BP 112/89   Pulse 84   Temp 98.5 F (36.9 C) (Oral)   Resp 16   Ht 5\' 5"  (1.651 m)   Wt 95.3 kg   SpO2 96%   BMI 34.95 kg/m   Physical Exam Vitals signs and nursing note reviewed.  Constitutional:      General: She is not in acute distress.    Appearance: She is well-developed. She is obese. She is not diaphoretic.  HENT:     Head: Normocephalic.     Mouth/Throat:     Mouth: Mucous membranes are moist.     Pharynx: Oropharynx is clear.  Eyes:     Pupils: Pupils are equal, round, and reactive to light.  Neck:     Musculoskeletal: Normal range of motion and neck supple.  Cardiovascular:     Rate and Rhythm: Normal rate and regular rhythm.     Pulses: Normal pulses.     Heart sounds: Normal heart sounds.  Pulmonary:     Effort: Pulmonary effort is normal.     Breath sounds: Normal breath sounds.  Abdominal:     General: Abdomen is flat. Bowel sounds are normal.     Tenderness: There is no abdominal tenderness. There is no guarding.  Musculoskeletal: Normal range of motion.        General: No tenderness.     Right lower leg: No edema.     Left lower leg: No edema.  Skin:    General: Skin is warm and dry.     Capillary Refill: Capillary refill takes less than 2 seconds.  Neurological:     General: No focal deficit present.     Mental Status: She is alert and oriented to person, place, and time.  Psychiatric:        Mood and Affect: Mood normal.        Behavior: Behavior normal.      ED Treatments / Results  Labs (all labs ordered are listed, but only abnormal results are displayed) Labs Reviewed  BASIC METABOLIC PANEL - Abnormal; Notable for the following components:      Result Value   Sodium 131 (*)    Potassium 3.2 (*)    CO2 21 (*)    Glucose, Bld 248 (*)    Calcium 8.7 (*)    All other components within normal limits  CBC - Abnormal; Notable for the following components:   WBC 14.4 (*)    Hemoglobin 15.2 (*)    All other components within  normal limits  TROPONIN I  PREGNANCY, URINE  TROPONIN I    EKG EKG Interpretation  Date/Time:  Monday December 20 2018 23:50:30 EST Ventricular Rate:  98 PR Interval:  152 QRS Duration: 84 QT Interval:  362 QTC Calculation: 462 R Axis:   94 Text Interpretation:  Normal sinus rhythm non specific ST changes seen in 2015 Confirmed by Naydelin Ziegler ([id number]) on  12/21/2018 1:37:09 AM   Radiology Dg Chest 2 View  Result Date: 12/21/2018 CLINICAL DATA:  Chest pain EXAM: CHEST - 2 VIEW COMPARISON:  06/29/2014 FINDINGS: The heart size and mediastinal contours are within normal limits. Both lungs are clear. The visualized skeletal structures are unremarkable. IMPRESSION: No active cardiopulmonary disease. Electronically Signed   By: Jasmine PangKim  Fujinaga M.D.   On: 12/21/2018 00:12    Procedures Procedures (including critical care time)  Medications Ordered in ED Medications  sodium chloride flush (NS) 0.9 % injection 3 mL (has no administration in time range)  PERC negative wells 0 highly doubt PE in this very low risk patient'  Ruled out for MI in the ed with 2 negative troponins.  EKG is unchanged from previous.  Heart score is 2 and patient is low risk for MACE.  She is declining admission at this time but given her DM and cholesterol I will refer the patient to cardiology for outpatient stress test.  Patient is amenable to this plan and agrees to follow up.  She has been given strict return precautions to return to the nearest ED for any further episodes and verbalizes understanding of all written and verbal instructions    Final Clinical Impressions(s) / ED Diagnoses   Final diagnoses:  Precordial pain    Return for pain, intractable cough, productive cough,fevers >100.4 unrelieved by medication, shortness of breath, intractable vomiting, or diarrhea, abdominal pain, Inability to tolerate liquids or food, cough, altered mental status or any concerns. No signs of systemic illness or  infection. The patient is nontoxic-appearing on exam and vital signs are within normal limits.   I have reviewed the triage vital signs and the nursing notes. Pertinent labs &imaging results that were available during my care of the patient were reviewed by me and considered in my medical decision making (see chart for details).  After history, exam, and medical workup I feel the patient has been appropriately medically screened and is safe for discharge home. Pertinent diagnoses were discussed with the patient. Patient was given return precautions.   Jakarri Lesko, MD 12/21/18 [id number]

## 2018-12-27 ENCOUNTER — Other Ambulatory Visit: Payer: Self-pay

## 2018-12-27 ENCOUNTER — Encounter (HOSPITAL_COMMUNITY): Payer: Self-pay | Admitting: Emergency Medicine

## 2018-12-27 ENCOUNTER — Emergency Department (HOSPITAL_COMMUNITY)
Admission: EM | Admit: 2018-12-27 | Discharge: 2018-12-27 | Disposition: A | Discharge status: Home / Self Care | Payer: Medicaid Other | Attending: Emergency Medicine | Admitting: Emergency Medicine

## 2018-12-27 DIAGNOSIS — F17210 Nicotine dependence, cigarettes, uncomplicated: Secondary | ICD-10-CM | POA: Insufficient documentation

## 2018-12-27 DIAGNOSIS — R1030 Lower abdominal pain, unspecified: Secondary | ICD-10-CM | POA: Insufficient documentation

## 2018-12-27 DIAGNOSIS — Z79899 Other long term (current) drug therapy: Secondary | ICD-10-CM | POA: Insufficient documentation

## 2018-12-27 DIAGNOSIS — L304 Erythema intertrigo: Secondary | ICD-10-CM | POA: Insufficient documentation

## 2018-12-27 DIAGNOSIS — E119 Type 2 diabetes mellitus without complications: Secondary | ICD-10-CM | POA: Insufficient documentation

## 2018-12-27 DIAGNOSIS — R109 Unspecified abdominal pain: Secondary | ICD-10-CM

## 2018-12-27 MED ORDER — KETOCONAZOLE 2 % EX CREA: 1.0000 "application " | TOPICAL_CREAM | Freq: Every day | CUTANEOUS | 0 refills | Status: DC

## 2018-12-27 NOTE — ED Notes (Signed)
Patient was given a cup of water. 
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
## 2019-03-28 ENCOUNTER — Other Ambulatory Visit: Payer: Self-pay

## 2019-03-28 ENCOUNTER — Encounter (HOSPITAL_BASED_OUTPATIENT_CLINIC_OR_DEPARTMENT_OTHER): Payer: Self-pay | Admitting: *Deleted

## 2019-03-28 ENCOUNTER — Emergency Department (HOSPITAL_BASED_OUTPATIENT_CLINIC_OR_DEPARTMENT_OTHER)
Admission: EM | Admit: 2019-03-28 | Discharge: 2019-03-29 | Disposition: A | Discharge status: Home / Self Care | Payer: Medicaid Other | Attending: Emergency Medicine | Admitting: Emergency Medicine

## 2019-03-28 DIAGNOSIS — L02211 Cutaneous abscess of abdominal wall: Secondary | ICD-10-CM | POA: Insufficient documentation

## 2019-03-28 DIAGNOSIS — E119 Type 2 diabetes mellitus without complications: Secondary | ICD-10-CM | POA: Insufficient documentation

## 2019-03-28 DIAGNOSIS — Z7984 Long term (current) use of oral hypoglycemic drugs: Secondary | ICD-10-CM | POA: Insufficient documentation

## 2019-03-28 DIAGNOSIS — L0291 Cutaneous abscess, unspecified: Secondary | ICD-10-CM

## 2019-03-28 DIAGNOSIS — Z79899 Other long term (current) drug therapy: Secondary | ICD-10-CM | POA: Insufficient documentation

## 2019-03-28 DIAGNOSIS — F17210 Nicotine dependence, cigarettes, uncomplicated: Secondary | ICD-10-CM | POA: Insufficient documentation

## 2019-03-28 LAB — CBC WITH DIFFERENTIAL/PLATELET
Abs Immature Granulocytes: 0.05 10*3/uL (ref 0.00–0.07)
Basophils Absolute: 0.1 10*3/uL (ref 0.0–0.1)
Basophils Relative: 1 %
Eosinophils Absolute: 0.2 10*3/uL (ref 0.0–0.5)
Eosinophils Relative: 1 %
HCT: 43.8 % (ref 36.0–46.0)
Hemoglobin: 15 g/dL (ref 12.0–15.0)
Immature Granulocytes: 0 %
Lymphocytes Relative: 31 %
Lymphs Abs: 5.1 10*3/uL — ABNORMAL HIGH (ref 0.7–4.0)
MCH: 33.3 pg (ref 26.0–34.0)
MCHC: 34.2 g/dL (ref 30.0–36.0)
MCV: 97.1 fL (ref 80.0–100.0)
Monocytes Absolute: 1.3 10*3/uL — ABNORMAL HIGH (ref 0.1–1.0)
Monocytes Relative: 8 %
Neutro Abs: 9.7 10*3/uL — ABNORMAL HIGH (ref 1.7–7.7)
Neutrophils Relative %: 59 %
Platelets: 275 10*3/uL (ref 150–400)
RBC: 4.51 MIL/uL (ref 3.87–5.11)
RDW: 12.5 % (ref 11.5–15.5)
Smear Review: NORMAL
WBC: 16.4 10*3/uL — ABNORMAL HIGH (ref 4.0–10.5)
nRBC: 0 % (ref 0.0–0.2)

## 2019-03-28 LAB — LACTIC ACID, PLASMA: Lactic Acid, Venous: 1.5 mmol/L (ref 0.5–1.9)

## 2019-03-28 LAB — COMPREHENSIVE METABOLIC PANEL
ALT: 23 U/L (ref 0–44)
AST: 21 U/L (ref 15–41)
Albumin: 3.6 g/dL (ref 3.5–5.0)
Alkaline Phosphatase: 113 U/L (ref 38–126)
Anion gap: 10 (ref 5–15)
BUN: 8 mg/dL (ref 6–20)
CO2: 21 mmol/L — ABNORMAL LOW (ref 22–32)
Calcium: 9 mg/dL (ref 8.9–10.3)
Chloride: 103 mmol/L (ref 98–111)
Creatinine, Ser: 0.52 mg/dL (ref 0.44–1.00)
GFR calc Af Amer: >60 mL/min (ref >60)
GFR calc non Af Amer: >60 mL/min (ref >60)
Glucose, Bld: 197 mg/dL — ABNORMAL HIGH (ref 70–99)
Potassium: 3.4 mmol/L — ABNORMAL LOW (ref 3.5–5.1)
Sodium: 134 mmol/L — ABNORMAL LOW (ref 135–145)
Total Bilirubin: 0.8 mg/dL (ref 0.3–1.2)
Total Protein: 7.7 g/dL (ref 6.5–8.1)

## 2019-03-28 LAB — PREGNANCY, URINE: Preg Test, Ur: NEGATIVE

## 2019-03-28 MED ORDER — FENTANYL CITRATE (PF) 100 MCG/2ML IJ SOLN
50.0000 ug | Freq: Once | INTRAMUSCULAR | Status: AC
  Administered 2019-03-28: 50 ug via INTRAVENOUS
  Filled 2019-03-28: qty 2

## 2019-03-28 MED ORDER — SODIUM CHLORIDE 0.9 % IV BOLUS
500.0000 mL | Freq: Once | INTRAVENOUS | Status: AC
  Administered 2019-03-28: 500 mL via INTRAVENOUS

## 2019-03-28 MED ORDER — ONDANSETRON HCL 4 MG/2ML IJ SOLN
4.0000 mg | Freq: Once | INTRAMUSCULAR | Status: AC
  Administered 2019-03-28: 4 mg via INTRAVENOUS
  Filled 2019-03-28: qty 2

## 2019-03-28 MED ORDER — CLINDAMYCIN PHOSPHATE 600 MG/50ML IV SOLN
600.0000 mg | Freq: Once | INTRAVENOUS | Status: AC
  Administered 2019-03-28: 600 mg via INTRAVENOUS
  Filled 2019-03-28: qty 50

## 2019-03-28 MED ORDER — LIDOCAINE HCL (PF) 1 % IJ SOLN
10.0000 mL | Freq: Once | INTRAMUSCULAR | Status: AC
  Administered 2019-03-28: 10 mL via INTRADERMAL
  Filled 2019-03-28: qty 10

## 2019-03-28 MED ORDER — CLINDAMYCIN HCL 150 MG PO CAPS: 450.0000 mg | ORAL_CAPSULE | Freq: Three times a day (TID) | ORAL | 0 refills | Status: AC

## 2019-03-28 NOTE — ED Provider Notes (Signed)
[facility] EMERGENCY DEPARTMENT Provider Note   CSN: [id number] Arrival date & time: 03/28/19  2147    History   Chief Complaint Chief Complaint  Patient presents with  . Abscess    HPI Breanna Norton is a 43 y.o. female past medical history is of diabetes who presents for evaluation of abscess, warmth, redness noted to abdomen x1 week.  Patient reports that her symptoms worsened over the last 3 days.  She states that initially about a week ago, she noticed some redness, warmth to her abdomen.  She states that eventually, the area started getting larger and the area became more painful, or swelling.  She states that a few days ago, she noticed a small area of swelling that she states felt was consistent with her previous abscesses.  She reports that about 3 days ago, it started draining but states it continues to hurt and cause her pain.  She has not noted any fevers at home but states she has not had thermometer to measure her temperature.  She has had some chills.  Patient reports a history of cellulitis and abscesses.  Patient states she has been compliant with her insulin.  Patient denies any nausea/vomiting.  Patient also reports she has potential beginning abscess on the right elbow.  She states that this been ongoing for the last several weeks.  She states initially, he hit the elbow on the corner of a washing machine and states that since then, she has started developing abscess.  She states she has been able to move it without any difficulty.     The history is provided by the patient.    Past Medical History:  Diagnosis Date  . Diabetes mellitus without complication (HCC)   . GERD (gastroesophageal reflux disease)   . High cholesterol   . PTSD (post-traumatic stress disorder)     There are no active problems to display for this patient.   Past Surgical History:  Procedure Laterality Date  . ABDOMINAL HYSTERECTOMY    . CHOLECYSTECTOMY    . oopharectomy    .  TONSILLECTOMY       OB History   No obstetric history on file.      Home Medications    Prior to Admission medications   Medication Sig Start Date End Date Taking? Authorizing Provider  clindamycin (CLEOCIN) 150 MG capsule Take 3 capsules (450 mg total) by mouth 3 (three) times daily for 7 days. 03/28/19 04/04/19  Maxwell Caul, PA-C  escitalopram (LEXAPRO) 20 MG tablet Take 20 mg by mouth daily.    [provider]  ezetimibe (ZETIA) 10 MG tablet Take 10 mg by mouth daily.    [provider]  GLIPIZIDE PO Take by mouth.    [provider]  ketoconazole (NIZORAL) 2 % cream Apply 1 application topically daily. 12/27/18   Liberty Handy, PA-C  Lactobacillus (ACIDOPHILUS PROBIOTIC) 10 MG TABS Take 10 mg by mouth 3 (three) times daily. 08/21/15   Everlene Farrier, PA-C  metFORMIN (GLUCOPHAGE) 1000 MG tablet Take 1,000 mg by mouth 2 (two) times daily with a meal.    [provider]  nystatin (MYCOSTATIN) 100000 UNIT/ML suspension Take 5 mLs (500,000 Units total) by mouth 4 (four) times daily. Swish and swallow 04/13/18   Loren Racer, MD  ondansetron (ZOFRAN ODT) 4 MG disintegrating tablet  ODT q4 hours prn nausea/vomit 04/13/18   Loren Racer, MD  OVER THE COUNTER MEDICATION Take 1 tablet by mouth at bedtime. Heartburn  medication    [provider]  oxyCODONE-acetaminophen (PERCOCET/ROXICET) 5-325 MG tablet Take 1-2 tablets by mouth every 8 (eight) hours as needed for severe pain. 03/29/19   Maxwell Caul, PA-C  pantoprazole (PROTONIX) 20 MG tablet Take 1 tablet (20 mg total) by mouth daily. 04/13/18   Loren Racer, MD  SITagliptin Phosphate (JANUVIA PO) Take by mouth.    [provider]    Family History History reviewed. No pertinent family history.  Social History Social History   Tobacco Use  . Smoking status: Current Every Day Smoker    Packs/day: 1.00  . Smokeless tobacco: Never Used  Substance Use Topics   . Alcohol use: No  . Drug use: No     Allergies   Keflex [cephalexin]; Morphine and related; Penicillins; and Trazodone and nefazodone   Review of Systems Review of Systems  Constitutional: Positive for chills.  Respiratory: Negative for cough and shortness of breath.   Cardiovascular: Negative for chest pain.  Gastrointestinal: Negative for abdominal pain, nausea and vomiting.  Genitourinary: Negative for dysuria and hematuria.  Skin: Positive for color change and wound.  Neurological: Negative for headaches.  All other systems reviewed and are negative.    Physical Exam Updated Vital Signs BP 116/82 (BP Location: Right Arm)   Pulse (!) 102   Temp 98.8 F (37.1 C) (Oral)   Resp 19   Ht 5' 5.5" (1.664 m)   Wt 99.3 kg   SpO2 99%   BMI 35.89 kg/m   Physical Exam Vitals signs and nursing note reviewed.  Constitutional:      Appearance: Normal appearance. She is well-developed.     Comments: Appears uncomfortable.   HENT:     Head: Normocephalic and atraumatic.  Eyes:     General: Lids are normal.     Conjunctiva/sclera: Conjunctivae normal.     Pupils: Pupils are equal, round, and reactive to light.  Neck:     Musculoskeletal: Full passive range of motion without pain.  Cardiovascular:     Rate and Rhythm: Normal rate and regular rhythm.     Pulses: Normal pulses.     Heart sounds: Normal heart sounds. No murmur. No friction rub. No gallop.   Pulmonary:     Effort: Pulmonary effort is normal.     Breath sounds: Normal breath sounds.  Abdominal:     Palpations: Abdomen is soft. Abdomen is not rigid.     Tenderness: There is no abdominal tenderness. There is no guarding.  Musculoskeletal: Normal range of motion.  Skin:    General: Skin is warm and dry.     Capillary Refill: Capillary refill takes less than 2 seconds.          Comments: 10 x 5 cm area of erythema, warmth, induration noted to the anterior aspect of the abdomen with central 2 cm area of  fluctuance.  It appears that this is opened up and has scabbed over.  Neurological:     Mental Status: She is alert and oriented to person, place, and time.  Psychiatric:        Speech: Speech normal.          ED Treatments / Results  Labs (all labs ordered are listed, but only abnormal results are displayed) Labs Reviewed  COMPREHENSIVE METABOLIC PANEL - Abnormal; Notable for the following components:      Result Value   Sodium 134 (*)    Potassium 3.4 (*)    CO2 21 (*)  Glucose, Bld 197 (*)    All other components within normal limits  CBC WITH DIFFERENTIAL/PLATELET - Abnormal; Notable for the following components:   WBC 16.4 (*)    Neutro Abs 9.7 (*)    Lymphs Abs 5.1 (*)    Monocytes Absolute 1.3 (*)    All other components within normal limits  AEROBIC CULTURE (SUPERFICIAL SPECIMEN)  PREGNANCY, URINE  LACTIC ACID, PLASMA    EKG None  Radiology No results found.  Procedures .Marland Kitchen.Incision and Drainage Date/Time: 03/29/2019 12:02 AM Performed by: Maxwell CaulLayden, Lindsey A, PA-C Authorized by: Maxwell CaulLayden, Lindsey A, PA-C   Consent:    Consent obtained:  Verbal   Consent given by:  Patient   Risks discussed:  Bleeding, pain, infection and incomplete drainage Location:    Type:  Abscess   Location:  Trunk   Trunk location:  Abdomen Pre-procedure details:    Skin preparation:  Betadine Anesthesia (see MAR for exact dosages):    Anesthesia method:  Local infiltration   Local anesthetic:  Lidocaine 1% w/o epi Procedure type:    Complexity:  Complex Procedure details:    Incision types:  Single straight   Scalpel blade:  11   Wound management:  Probed and deloculated, irrigated with saline, extensive cleaning and debrided   Drainage:  Purulent   Drainage amount:  Scant   Wound treatment:  Wound left open Post-procedure details:    Patient tolerance of procedure:  Tolerated well, no immediate complications   (including critical care time)  Medications Ordered in  ED Medications  ondansetron (ZOFRAN) injection 4 mg (4 mg Intravenous Given 03/28/19 2233)  fentaNYL (SUBLIMAZE) injection 50 mcg (50 mcg Intravenous Given 03/28/19 2233)  fentaNYL (SUBLIMAZE) injection 50 mcg (50 mcg Intravenous Given 03/28/19 2323)  sodium chloride 0.9 % bolus 500 mL (0 mLs Intravenous Stopped 03/28/19 2352)  lidocaine (PF) (XYLOCAINE) 1 % injection 10 mL (10 mLs Intradermal Given 03/28/19 2323)  clindamycin (CLEOCIN) IVPB 600 mg (0 mg Intravenous Stopped 03/29/19 0054)  oxyCODONE-acetaminophen (PERCOCET/ROXICET) 5-325 MG per tablet 1 tablet (1 tablet Oral Given 03/29/19 0022)  sodium chloride 0.9 % bolus 500 mL (0 mLs Intravenous Stopped 03/29/19 0125)     Initial Impression / Assessment and Plan / ED Course  I have reviewed the triage vital signs and the nursing notes.  Pertinent labs & imaging results that were available during my care of the patient were reviewed by me and considered in my medical decision making (see chart for details).        43 year old female with past medical of  diabetes who presents for evaluation of warmth, erythema, induration and wound noted to the anterior aspect of abdomen x1 week.  Worse in the last 3 days.  She states that it started getting bigger consistent with an abscess that she previously had experienced started draining.  She reports that despite draining, she still has significant pain and started having worsening spreading redness.  She has not measured a temperature but has had chills.  Initial ED arrival, she is afebrile but is tachycardic and slightly hypertensive.  On exam, she has a 10 x 5 cm area of warmth, erythema, induration with a central area of about 2 cm that appears to be fluctuant.  Also appears that this area has been draining and has been scabbed over.  Plan to check labs, bedside ultrasound  Ultrasound reviewed.  There does appear to be a small area of fluid collection that is about 1 cm deep that would  be amenable to  I&D.  This directly correlates over the area that has already been draining. She also has indications for surrounding cellulitis.  I discussed with patient.  Patient agreeable to I&D here in the department.  CBC shows leukocytosis of 16.4.  Otherwise unremarkable.  Lactic negative.  CMP shows potassium 3.4.  Otherwise unremarkable.  I&D as documented above.  Patient tolerated procedure well.  It appears that most the area had been draining.  There was a small amount of purulent drainage.  The area was debrided and cleaned out thoroughly and extensively.  The area of redness was marked with a skin marker.  Will give patient a dose of IV clindamycin here in the department.  Instructed patient that she will need to follow-up in 2 days for wound recheck.  Instructed her to return to emergency department sooner for any fever, redness or swelling that extends beyond the marked measures.  At this time, do not feel that patient needs inpatient treatment or admission. At this time, patient exhibits no emergent life-threatening condition that require further evaluation in ED or admission. Patient had ample opportunity for questions and discussion. All patient's questions were answered with full understanding. Strict return precautions discussed. Patient expresses understanding and agreement to plan.    EMERGENCY DEPARTMENT US SOFT TISSUE INTERPRETATION "Study: Limited Soft Tissue Ultrasound"  INDICATIONS: Pain and Soft tissue infection Multiple views of the body part were obtained in real-time with a multi-frequency linear probe  PERFORMED BY: Myself IMAGES ARCHIVED?: Yes SIDE:Midline BODY PART:Abdominal wall INTERPRETATION:  Abcess present and Cellulitis present   Portions of this note were generated with Dragon dictation software. Dictation errors may occur despite best attempts at proofreading.   Final Clinical Impressions(s) / ED Diagnoses   Final diagnoses:  Abscess    ED Discharge Orders          Ordered    oxyCODONE-acetaminophen (PERCOCET/ROXICET) 5-325 MG tablet  Every 8 hours PRN     03/29/19 0006    clindamycin (CLEOCIN) 150 MG capsule  3 times daily     03/28/19 2358           Maxwell Caul, PA-C 03/29/19 1633    Rolan Bucco, MD 03/29/19 1801

## 2019-03-28 NOTE — Discharge Instructions (Addendum)
You can take Tylenol or Ibuprofen as directed for pain. You can alternate Tylenol and Ibuprofen every 4 hours. If you take Tylenol at 1pm, then you can take Ibuprofen at 5pm. Then you can take Tylenol again at 9pm.   Take pain medications as directed for break through pain. Do not drive or operate machinery while taking this medication.   Take antibiotics as directed. Please take all of your antibiotics until finished.  Keep the wound clean and dry.  You may gently wash with soap and water.  Make sure it is dry before you put any bandage.  As we discussed, return to the emergency department in 2 days for a wound check.  Return sooner if you start having fever, redness starts extending outside the line, worsening pain, nausea/vomiting or any other worsening or concerning symptoms.

## 2019-03-28 NOTE — ED Notes (Signed)
ED Provider at bedside. 

## 2019-03-28 NOTE — ED Triage Notes (Signed)
Pt c/o abd abscess x 3 days , yellow drainage

## 2019-03-29 MED ORDER — SODIUM CHLORIDE 0.9 % IV BOLUS
500.0000 mL | Freq: Once | INTRAVENOUS | Status: AC
  Administered 2019-03-29: 500 mL via INTRAVENOUS

## 2019-03-29 MED ORDER — OXYCODONE-ACETAMINOPHEN 5-325 MG PO TABS: 1.0000 | ORAL_TABLET | Freq: Three times a day (TID) | ORAL | 0 refills | Status: DC | PRN

## 2019-03-29 MED ORDER — OXYCODONE-ACETAMINOPHEN 5-325 MG PO TABS
1.0000 | ORAL_TABLET | Freq: Once | ORAL | Status: AC
  Administered 2019-03-29: 1 via ORAL
  Filled 2019-03-29: qty 1

## 2019-03-29 NOTE — ED Notes (Signed)
Pt understood dc material. NAD noted. Scripts sent electronically. All questions answered to satisfaction. Reviewed the fact she needs a recheck in 2 days. Pt escorted to checkout window.

## 2019-03-29 NOTE — ED Notes (Signed)
EDP aware of discharge vitals prior to discharge

## 2019-03-30 ENCOUNTER — Encounter (HOSPITAL_BASED_OUTPATIENT_CLINIC_OR_DEPARTMENT_OTHER): Payer: Self-pay

## 2019-03-30 ENCOUNTER — Other Ambulatory Visit: Payer: Self-pay

## 2019-03-30 ENCOUNTER — Emergency Department (HOSPITAL_BASED_OUTPATIENT_CLINIC_OR_DEPARTMENT_OTHER)
Admission: EM | Admit: 2019-03-30 | Discharge: 2019-03-30 | Disposition: A | Discharge status: Home / Self Care | Payer: Medicaid Other | Attending: Emergency Medicine | Admitting: Emergency Medicine

## 2019-03-30 DIAGNOSIS — Z5321 Procedure and treatment not carried out due to patient leaving prior to being seen by health care provider: Secondary | ICD-10-CM | POA: Insufficient documentation

## 2019-03-30 DIAGNOSIS — Z4800 Encounter for change or removal of nonsurgical wound dressing: Secondary | ICD-10-CM | POA: Insufficient documentation

## 2019-03-30 NOTE — ED Triage Notes (Signed)
Pt states she is here for recheck of abscess to abd and to be seen for abscess like area to left elbow-NAD-steady gait

## 2019-03-31 LAB — AEROBIC CULTURE W GRAM STAIN (SUPERFICIAL SPECIMEN): Special Requests: NORMAL

## 2019-03-31 LAB — AEROBIC CULTURE? (SUPERFICIAL SPECIMEN)

## 2019-04-01 ENCOUNTER — Telehealth: Payer: Self-pay | Admitting: *Deleted

## 2019-04-01 NOTE — Telephone Encounter (Signed)
Post ED Visit - Positive Culture Follow-up  Culture report reviewed by antimicrobial stewardship pharmacist: [facility] []  Nathan Batchelder, Pharm.D. []  [place], Pharm.D., BCPS AQ-ID []  Celedonio Miyamoto, Pharm.D., BCPS []  Garvin Fila, Pharm.D., BCPS []  [place], [place].D., BCPS, AAHIVP []  [address], Pharm.D., BCPS, AAHIVP [x]  Estella Husk, PharmD, BCPS []  Lysle Pearl, PharmD, BCPS []  Phillips Climes, PharmD, BCPS []  Agapito Games, PharmD []  Verlan Friends, PharmD, BCPS []  Mervyn Gay, PharmD  [facility] []  [facility], PharmD []  Len Childs, PharmD []  Greer Pickerel, PharmD []  Adalberto Cole, Rph []  Perlie Gold) Lonell Face, PharmD []  Jean Rosenthal, PharmD []  Earl Many, PharmD []  Junita Push, PharmD []  Dorna Leitz, PharmD []  Terrilee Files, PharmD []  Lynann Beaver, PharmD []  Keturah Barre, PharmD []  Loralee Pacas, PharmD   Positive wound culture Treated with Clindamycin HCL, organism sensitive to the same and no further patient follow-up is required at this time.  Bernadene Person [facility] 04/01/2019, 8:34 AM

## 2020-07-26 ENCOUNTER — Emergency Department (HOSPITAL_BASED_OUTPATIENT_CLINIC_OR_DEPARTMENT_OTHER): Payer: Medicaid Other

## 2020-07-26 ENCOUNTER — Emergency Department (HOSPITAL_BASED_OUTPATIENT_CLINIC_OR_DEPARTMENT_OTHER)
Admission: EM | Admit: 2020-07-26 | Discharge: 2020-07-26 | Disposition: A | Discharge status: Home / Self Care | Payer: Medicaid Other | Attending: Emergency Medicine | Admitting: Emergency Medicine

## 2020-07-26 ENCOUNTER — Encounter (HOSPITAL_BASED_OUTPATIENT_CLINIC_OR_DEPARTMENT_OTHER): Payer: Self-pay | Admitting: Emergency Medicine

## 2020-07-26 ENCOUNTER — Other Ambulatory Visit: Payer: Self-pay

## 2020-07-26 DIAGNOSIS — R0602 Shortness of breath: Secondary | ICD-10-CM | POA: Insufficient documentation

## 2020-07-26 DIAGNOSIS — F17200 Nicotine dependence, unspecified, uncomplicated: Secondary | ICD-10-CM | POA: Diagnosis not present

## 2020-07-26 DIAGNOSIS — Z79899 Other long term (current) drug therapy: Secondary | ICD-10-CM | POA: Insufficient documentation

## 2020-07-26 DIAGNOSIS — M791 Myalgia, unspecified site: Secondary | ICD-10-CM | POA: Diagnosis not present

## 2020-07-26 DIAGNOSIS — Z7984 Long term (current) use of oral hypoglycemic drugs: Secondary | ICD-10-CM | POA: Insufficient documentation

## 2020-07-26 DIAGNOSIS — J069 Acute upper respiratory infection, unspecified: Secondary | ICD-10-CM

## 2020-07-26 DIAGNOSIS — Z20822 Contact with and (suspected) exposure to covid-19: Secondary | ICD-10-CM | POA: Insufficient documentation

## 2020-07-26 DIAGNOSIS — E119 Type 2 diabetes mellitus without complications: Secondary | ICD-10-CM | POA: Diagnosis not present

## 2020-07-26 LAB — BASIC METABOLIC PANEL
Anion gap: 10 (ref 5–15)
BUN: 6 mg/dL (ref 6–20)
CO2: 22 mmol/L (ref 22–32)
Calcium: 8.9 mg/dL (ref 8.9–10.3)
Chloride: 103 mmol/L (ref 98–111)
Creatinine, Ser: 0.54 mg/dL (ref 0.44–1.00)
GFR calc Af Amer: >60 mL/min (ref >60)
GFR calc non Af Amer: >60 mL/min (ref >60)
Glucose, Bld: 213 mg/dL — ABNORMAL HIGH (ref 70–99)
Potassium: 3.8 mmol/L (ref 3.5–5.1)
Sodium: 135 mmol/L (ref 135–145)

## 2020-07-26 LAB — SARS CORONAVIRUS 2 BY RT PCR (HOSPITAL ORDER, PERFORMED IN ~~LOC~~ HOSPITAL LAB): SARS Coronavirus 2: NEGATIVE

## 2020-07-26 LAB — D-DIMER, QUANTITATIVE: D-Dimer, Quant: 1.01 ug/mL-FEU — ABNORMAL HIGH (ref 0.00–0.50)

## 2020-07-26 LAB — TROPONIN I (HIGH SENSITIVITY): Troponin I (High Sensitivity): 3 ng/L (ref <18)

## 2020-07-26 MED ORDER — SODIUM CHLORIDE 0.9 % IV BOLUS
1000.0000 mL | Freq: Once | INTRAVENOUS | Status: AC
  Administered 2020-07-26: 1000 mL via INTRAVENOUS

## 2020-07-26 MED ORDER — IOHEXOL 350 MG/ML SOLN
100.0000 mL | Freq: Once | INTRAVENOUS | Status: AC | PRN
  Administered 2020-07-26: 100 mL via INTRAVENOUS

## 2020-07-26 NOTE — ED Provider Notes (Signed)
[facility] EMERGENCY DEPARTMENT Provider Note   CSN: [id number] Arrival date & time: 07/26/20  1118     History Chief Complaint  Patient presents with  . Shortness of Breath    Hartlyn Reigel is a 44 y.o. female.  Patient with shortness of breath, body aches for the last several days.  Has history of diabetes, high cholesterol.  No specific chest pain.  No PE or DVT history.  Had a negative Covid test this morning.  The history is provided by the patient.  Shortness of Breath Severity:  Mild Onset quality:  Gradual Timing:  Intermittent Progression:  Waxing and waning Chronicity:  New Context: activity   Relieved by:  Nothing Worsened by:  Nothing Associated symptoms: cough   Associated symptoms: no abdominal pain, no chest pain, no ear pain, no fever, no rash, no sore throat and no vomiting   Risk factors: no hx of PE/DVT and no recent surgery        Past Medical History:  Diagnosis Date  . Diabetes mellitus without complication (HCC)   . GERD (gastroesophageal reflux disease)   . High cholesterol   . PTSD (post-traumatic stress disorder)     There are no problems to display for this patient.   Past Surgical History:  Procedure Laterality Date  . ABDOMINAL HYSTERECTOMY    . CHOLECYSTECTOMY    . oopharectomy    . TONSILLECTOMY       OB History   No obstetric history on file.     No family history on file.  Social History   Tobacco Use  . Smoking status: Current Every Day Smoker    Packs/day: 1.00  . Smokeless tobacco: Never Used  Vaping Use  . Vaping Use: Never used  Substance Use Topics  . Alcohol use: No  . Drug use: No    Home Medications Prior to Admission medications   Medication Sig Start Date End Date Taking? Authorizing Provider  escitalopram (LEXAPRO) 20 MG tablet Take 20 mg by mouth daily.    [provider]  ezetimibe (ZETIA) 10 MG tablet Take 10 mg by mouth daily.    [provider]  GLIPIZIDE PO  Take by mouth.    [provider]  ketoconazole (NIZORAL) 2 % cream Apply 1 application topically daily. 12/27/18   Liberty Handy, PA-C  Lactobacillus (ACIDOPHILUS PROBIOTIC) 10 MG TABS Take 10 mg by mouth 3 (three) times daily. 08/21/15   Everlene Farrier, PA-C  metFORMIN (GLUCOPHAGE) 1000 MG tablet Take 1,000 mg by mouth 2 (two) times daily with a meal.    [provider]  nystatin (MYCOSTATIN) 100000 UNIT/ML suspension Take 5 mLs (500,000 Units total) by mouth 4 (four) times daily. Swish and swallow 04/13/18   Loren Racer, MD  ondansetron (ZOFRAN ODT) 4 MG disintegrating tablet 4mg  ODT q4 hours prn nausea/vomit 04/13/18   04/15/18, MD  OVER THE COUNTER MEDICATION Take 1 tablet by mouth at bedtime. Heartburn medication    [provider]  oxyCODONE-acetaminophen (PERCOCET/ROXICET) 5-325 MG tablet Take 1-2 tablets by mouth every 8 (eight) hours as needed for severe pain. 03/29/19   03/31/19, PA-C  pantoprazole (PROTONIX) 20 MG tablet Take 1 tablet (20 mg total) by mouth daily. 04/13/18   04/15/18, MD  SITagliptin Phosphate (JANUVIA PO) Take by mouth.    [provider]    Allergies    Keflex [cephalexin], Morphine and related, Penicillins, and Trazodone and nefazodone  Review of Systems  Review of Systems  Constitutional: Negative for chills and fever.  HENT: Negative for ear pain and sore throat.   Eyes: Negative for pain and visual disturbance.  Respiratory: Positive for cough and shortness of breath.   Cardiovascular: Negative for chest pain and palpitations.  Gastrointestinal: Negative for abdominal pain and vomiting.  Genitourinary: Negative for dysuria and hematuria.  Musculoskeletal: Positive for myalgias. Negative for arthralgias and back pain.  Skin: Negative for color change and rash.  Neurological: Negative for seizures and syncope.  All other systems reviewed and are negative.   Physical Exam Updated Vital  Signs  ED Triage Vitals  Enc Vitals Group     BP 07/26/20 1135 110/81     Pulse Rate 07/26/20 1135 (!) 112     Resp 07/26/20 1135 18     Temp 07/26/20 1135 98.9 F (37.2 C)     Temp Source 07/26/20 1135 Oral     SpO2 07/26/20 1135 99 %     Weight 07/26/20 1132 216 lb (98 kg)     Height 07/26/20 1132 5\' 5"  (1.651 m)     Head Circumference --      Peak Flow --      Pain Score 07/26/20 1132 4     Pain Loc --      Pain Edu? --      Excl. in GC? --     Physical Exam Vitals and nursing note reviewed.  Constitutional:      General: She is not in acute distress.    Appearance: She is well-developed. She is not ill-appearing.  HENT:     Head: Normocephalic and atraumatic.  Eyes:     Conjunctiva/sclera: Conjunctivae normal.     Pupils: Pupils are equal, round, and reactive to light.  Cardiovascular:     Rate and Rhythm: Normal rate and regular rhythm.     Pulses: Normal pulses.     Heart sounds: Normal heart sounds. No murmur heard.   Pulmonary:     Effort: Pulmonary effort is normal. No respiratory distress.     Breath sounds: Normal breath sounds. No decreased breath sounds, wheezing or rhonchi.  Abdominal:     Palpations: Abdomen is soft.     Tenderness: There is no abdominal tenderness.  Musculoskeletal:     Cervical back: Normal range of motion and neck supple.     Right lower leg: No edema.     Left lower leg: No edema.  Skin:    General: Skin is warm and dry.     Capillary Refill: Capillary refill takes less than 2 seconds.  Neurological:     General: No focal deficit present.     Mental Status: She is alert.  Psychiatric:        Mood and Affect: Mood normal.     ED Results / Procedures / Treatments   Labs (all labs ordered are listed, but only abnormal results are displayed) Labs Reviewed  D-DIMER, QUANTITATIVE (NOT AT [facility]) - Abnormal; Notable for the following components:      Result Value   D-Dimer, Quant 1.01 (*)    All other components within normal  limits  SARS CORONAVIRUS 2 BY RT PCR (HOSPITAL ORDER, PERFORMED IN [facility])  CBC WITH DIFFERENTIAL/PLATELET  BASIC METABOLIC PANEL  TROPONIN I (HIGH SENSITIVITY)    EKG EKG Interpretation  Date/Time:  Thursday July 26 2020 11:38:27 EDT Ventricular Rate:  118 PR Interval:  142 QRS Duration: 78 QT Interval:  336  QTC Calculation: 470 R Axis:   111 Text Interpretation: Sinus tachycardia with occasional Premature ventricular complexes Left posterior fascicular block Abnormal ECG Confirmed by Virgina Norfolk [id number]) on 07/26/2020 12:00:03 PM   Radiology DG Chest Portable 1 View  Result Date: 07/26/2020 CLINICAL DATA:  Shortness of breath, body aches and cough beginning this morning. EXAM: PORTABLE CHEST 1 VIEW COMPARISON:  PA and lateral chest 12/20/2018. FINDINGS: Lungs clear. Heart size normal. No pneumothorax or pleural fluid. No acute or focal bony abnormality. IMPRESSION: Normal chest. Electronically Signed   By: Drusilla Kanner M.D.   On: 07/26/2020 12:13    Procedures Procedures (including critical care time)  Medications Ordered in ED Medications  sodium chloride 0.9 % bolus 1,000 mL (1,000 mLs Intravenous New Bag/Given 07/26/20 1515)    ED Course  I have reviewed the triage vital signs and the nursing notes.  Pertinent labs & imaging results that were available during my care of the patient were reviewed by me and considered in my medical decision making (see chart for details).    MDM Rules/Calculators/A&P                          Perle Brickhouse is a 44 year old female with history of diabetes, high cholesterol who presents to the ED with shortness of breath.  Mild tachycardia but otherwise unremarkable vitals.  She has had body aches and cough since this morning.  She has had shortness of breath for the last couple days on and off.  No specific chest pain.  States that she had a rapid negative Covid test this morning.  Chest x-ray showed no  pneumothorax or pleural effusion.  Covid test here is negative.  Given tachycardia and shortness of breath will evaluate for PE with D-dimer.  Will get troponin.  EKG shows sinus tachycardia.  No ischemic changes.  Will get lab work to evaluate for anemia, electrolyte abnormality.  Signed out to oncoming ED staff at lab work pending. Please see note for further results/eval.  This chart was dictated using voice recognition software.  Despite best efforts to proofread,  errors can occur which can change the documentation meaning.   Final Clinical Impression(s) / ED Diagnoses Final diagnoses:  SOB (shortness of breath)    Rx / DC Orders ED Discharge Orders    None       Virgina Norfolk, DO 07/26/20 1533

## 2020-07-26 NOTE — ED Triage Notes (Signed)
SOB, body aches, cough since this morning. Neg rapid COVID test this am.

## 2020-07-26 NOTE — Discharge Instructions (Addendum)
Your Covid test is negative today.  Your CT did not show any blood clot or pneumonia.  You likely have a viral infection that is not related to Covid.  Take Tylenol or Motrin if you have fevers or chills.  Stay hydrated.  See your doctor for follow-up.  Return to ER if you have worse shortness of breath, fever, trouble breathing.

## 2020-07-26 NOTE — ED Provider Notes (Signed)
  Physical Exam  BP 108/66   Pulse 66   Temp 98.9 F (37.2 C) (Oral)   Resp (!) 22   Ht 5\' 5"  (1.651 m)   Wt 98 kg   SpO2 100%   BMI 35.94 kg/m   Physical Exam  ED Course/Procedures     Procedures  MDM  Care assumed at 3 PM.  Patient is here with subjective shortness of breath and chills.  Covid was negative earlier today and another Covid test was sent and was negative.  Patient was tachycardic so D-dimer was sent and was elevated.  Signout pending CTA chest.  5:36 PM CTA showed no PE or pneumonia.  Troponin is negative and she is low risk so Dr. did not want a second troponin.  No chest pain right now.  Her tachycardia resolved with IV fluids.  I think she may have a upper respiratory infection from an another virus.  Stable for discharge and recommend Tylenol and Motrin for fever or chills.    Sandy Salaam, MD 07/26/20 [phone]

## 2020-12-16 IMAGING — CT CT ANGIO CHEST
2 of 8 series · 19 of 36 positions shown · IV contrast (omnipaque)
Comparison: Chest x-ray 07/26/2020, CT chest 11/03/2013

CLINICAL DATA: Shortness of breath cough body ache

EXAM:
CT ANGIOGRAPHY CHEST WITH CONTRAST
TECHNIQUE: Multidetector CT imaging of the chest was performed using the
standard protocol during bolus administration of intravenous
contrast. Multiplanar CT image reconstructions and MIPs were
obtained to evaluate the vascular anatomy.
CONTRAST:  100mL OMNIPAQUE IOHEXOL 350 MG/ML SOLN

[Series 6: pe coronal mpr · coronal · 0.51mm/px · 1 of 101 slices shown]
[im 51/101  mediastinal]
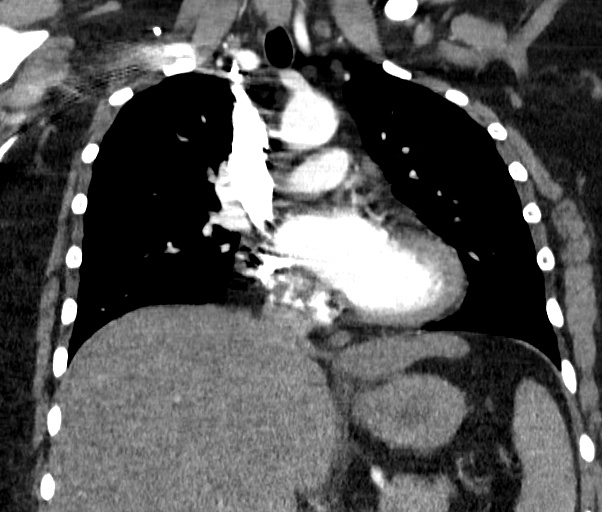

[Series 10: pe thins · axial · 0.69mm/px · z∈[-344,-104]mm · 18 of 355 slices shown]
[im 18/355  lung]
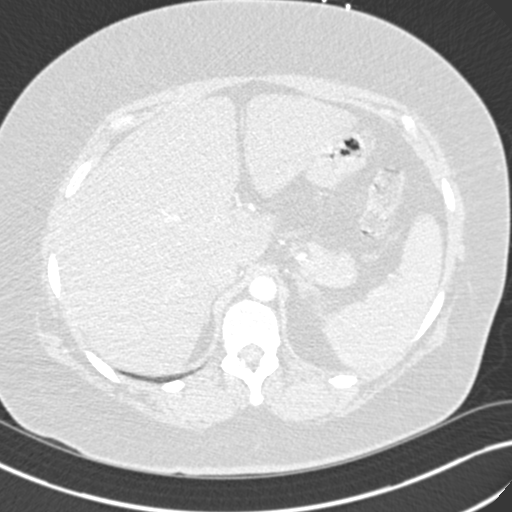
[im 36/355  mediastinal]
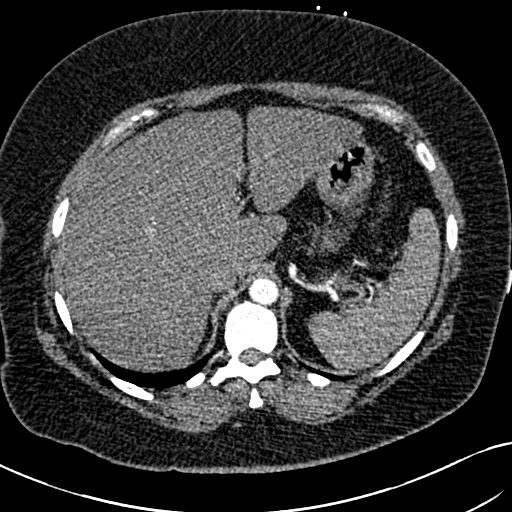
[im 54/355  lung]
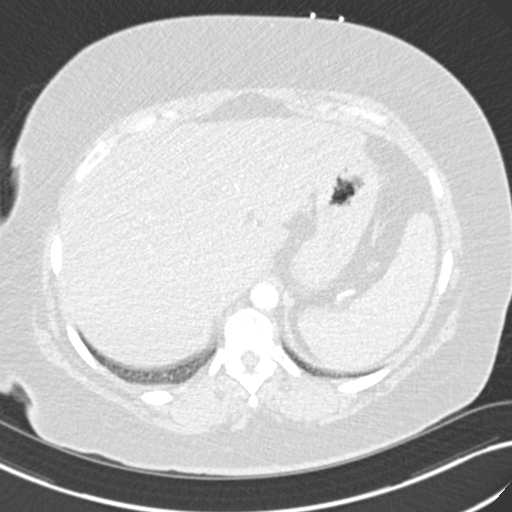
[im 71/355  mediastinal]
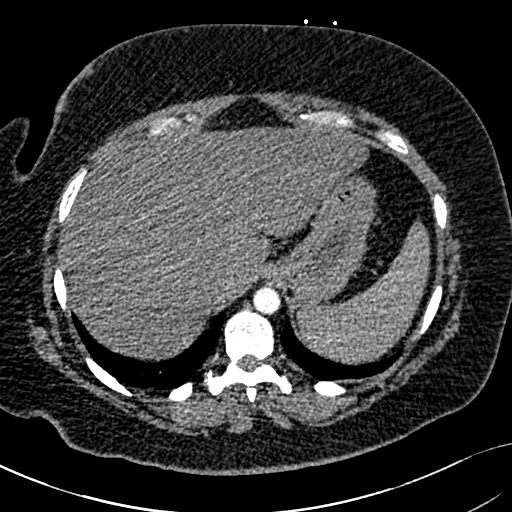
[im 89/355  lung]
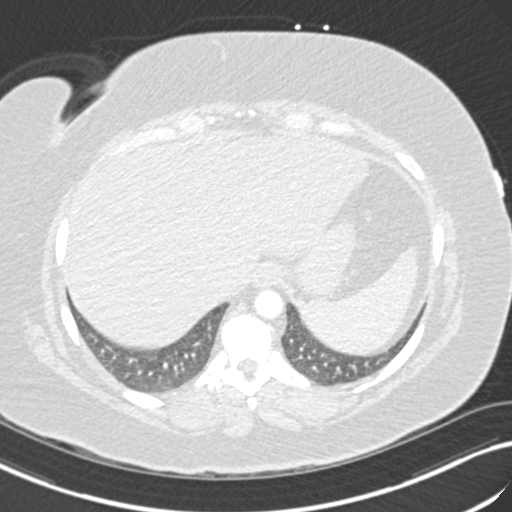
[im 107/355  mediastinal]
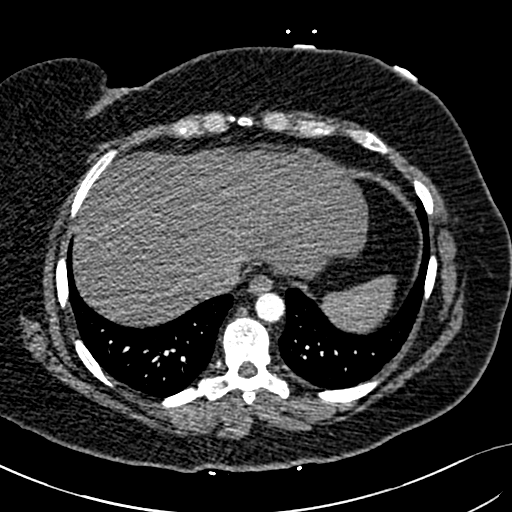
[im 124/355  lung]
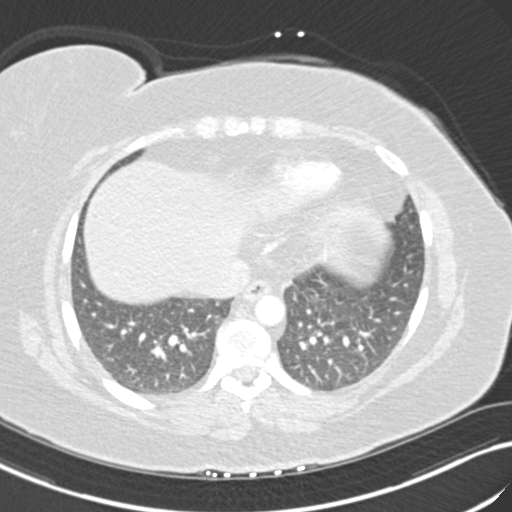
[im 142/355  mediastinal]
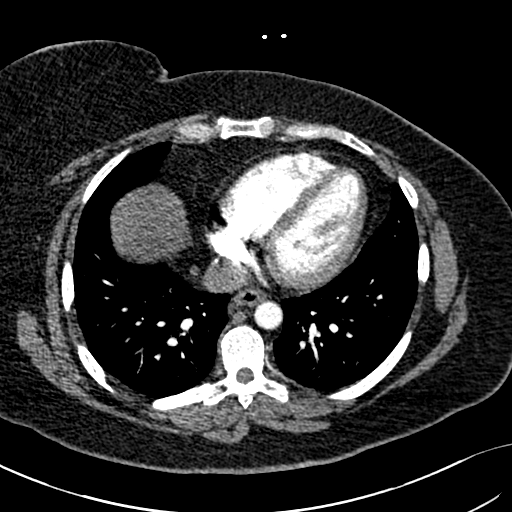
[im 160/355  lung]
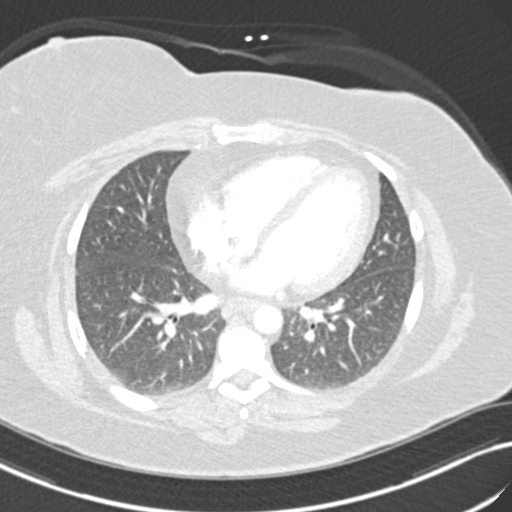
[im 195/355  mediastinal]
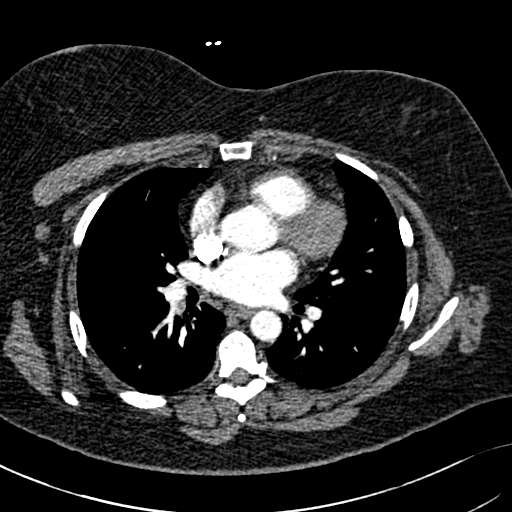
[im 213/355  lung]
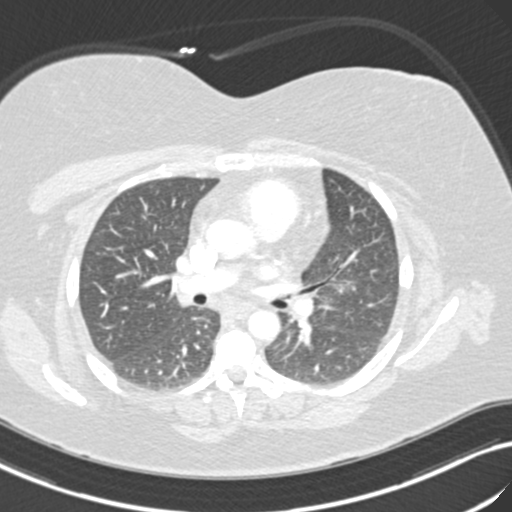
[im 231/355  mediastinal]
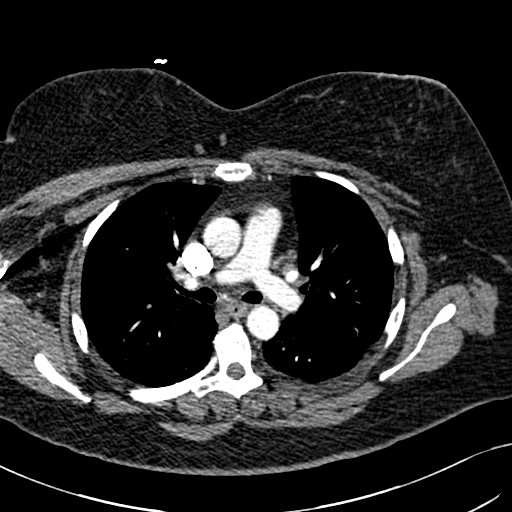
[im 248/355  lung]
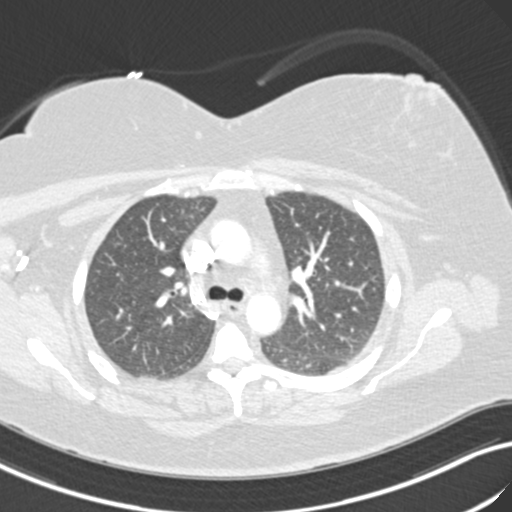
[im 266/355  mediastinal]
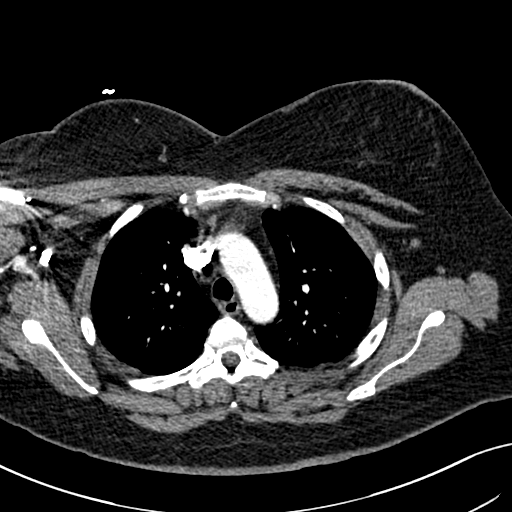
[im 284/355  lung]
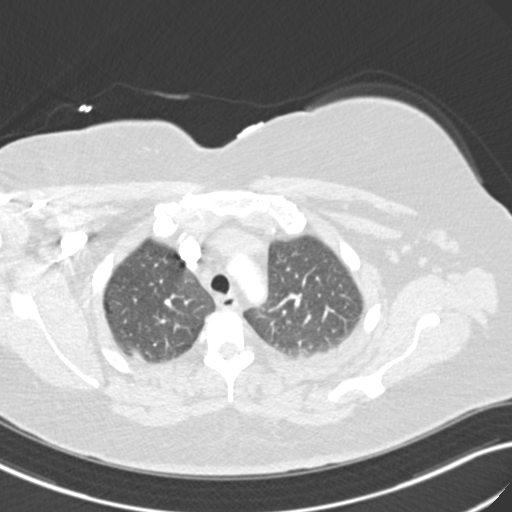
[im 301/355  mediastinal]
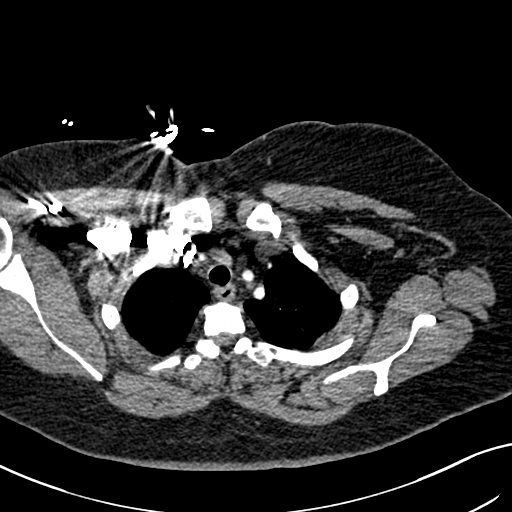
[im 319/355  lung]
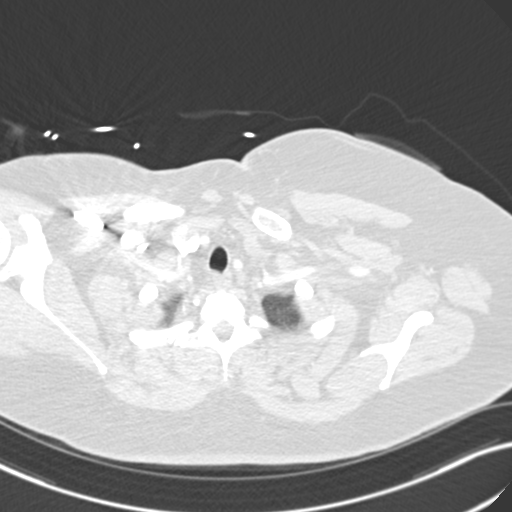
[im 337/355  mediastinal]
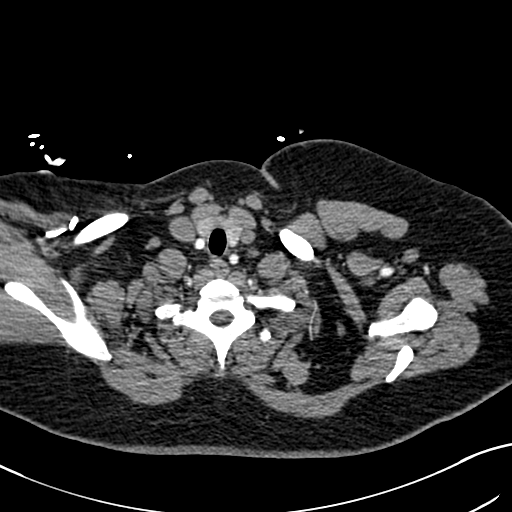

[19 of 36 positions shown; findings below may reference images not displayed]

FINDINGS: Cardiovascular: Satisfactory opacification of the pulmonary arteries
to the segmental level. No evidence of pulmonary embolism. Normal
heart size. No pericardial effusion. Aorta is nonaneurysmal. No
dissection is seen.

Mediastinum/Nodes: No enlarged mediastinal, hilar, or axillary lymph
nodes. Thyroid gland, trachea, and esophagus demonstrate no
significant findings.

Lungs/Pleura: Lungs are clear. No pleural effusion or pneumothorax.

Upper Abdomen: No acute abnormality.

Musculoskeletal: No chest wall abnormality. No acute or significant
osseous findings.

Review of the MIP images confirms the above findings.
IMPRESSION: Negative. No CT evidence for acute pulmonary embolus or aortic
dissection. Clear lung fields.

## 2022-09-20 ENCOUNTER — Emergency Department (HOSPITAL_BASED_OUTPATIENT_CLINIC_OR_DEPARTMENT_OTHER)
Admission: EM | Admit: 2022-09-20 | Discharge: 2022-09-20 | Disposition: A | Discharge status: Home / Self Care | Payer: BLUE CROSS/BLUE SHIELD | Attending: Emergency Medicine | Admitting: Emergency Medicine

## 2022-09-20 ENCOUNTER — Encounter (HOSPITAL_BASED_OUTPATIENT_CLINIC_OR_DEPARTMENT_OTHER): Payer: Self-pay | Admitting: Emergency Medicine

## 2022-09-20 ENCOUNTER — Emergency Department (HOSPITAL_BASED_OUTPATIENT_CLINIC_OR_DEPARTMENT_OTHER): Payer: BLUE CROSS/BLUE SHIELD

## 2022-09-20 ENCOUNTER — Other Ambulatory Visit: Payer: Self-pay

## 2022-09-20 DIAGNOSIS — E1165 Type 2 diabetes mellitus with hyperglycemia: Secondary | ICD-10-CM | POA: Insufficient documentation

## 2022-09-20 DIAGNOSIS — R197 Diarrhea, unspecified: Secondary | ICD-10-CM | POA: Diagnosis not present

## 2022-09-20 DIAGNOSIS — Z7984 Long term (current) use of oral hypoglycemic drugs: Secondary | ICD-10-CM | POA: Insufficient documentation

## 2022-09-20 DIAGNOSIS — D72829 Elevated white blood cell count, unspecified: Secondary | ICD-10-CM | POA: Insufficient documentation

## 2022-09-20 DIAGNOSIS — R748 Abnormal levels of other serum enzymes: Secondary | ICD-10-CM | POA: Insufficient documentation

## 2022-09-20 DIAGNOSIS — R112 Nausea with vomiting, unspecified: Secondary | ICD-10-CM | POA: Insufficient documentation

## 2022-09-20 DIAGNOSIS — Z20822 Contact with and (suspected) exposure to covid-19: Secondary | ICD-10-CM | POA: Insufficient documentation

## 2022-09-20 LAB — CBC WITH DIFFERENTIAL/PLATELET
Abs Immature Granulocytes: 0.05 10*3/uL (ref 0.00–0.07)
Basophils Absolute: 0.1 10*3/uL (ref 0.0–0.1)
Basophils Relative: 1 %
Eosinophils Absolute: 0.2 10*3/uL (ref 0.0–0.5)
Eosinophils Relative: 1 %
HCT: 51.5 % — ABNORMAL HIGH (ref 36.0–46.0)
Hemoglobin: 17.9 g/dL — ABNORMAL HIGH (ref 12.0–15.0)
Immature Granulocytes: 0 %
Lymphocytes Relative: 23 %
Lymphs Abs: 2.8 10*3/uL (ref 0.7–4.0)
MCH: 32.8 pg (ref 26.0–34.0)
MCHC: 34.8 g/dL (ref 30.0–36.0)
MCV: 94.5 fL (ref 80.0–100.0)
Monocytes Absolute: 1 10*3/uL (ref 0.1–1.0)
Monocytes Relative: 8 %
Neutro Abs: 8.3 10*3/uL — ABNORMAL HIGH (ref 1.7–7.7)
Neutrophils Relative %: 67 %
Platelets: 277 10*3/uL (ref 150–400)
RBC: 5.45 MIL/uL — ABNORMAL HIGH (ref 3.87–5.11)
RDW: 13 % (ref 11.5–15.5)
WBC: 12.4 10*3/uL — ABNORMAL HIGH (ref 4.0–10.5)
nRBC: 0 % (ref 0.0–0.2)

## 2022-09-20 LAB — I-STAT VENOUS BLOOD GAS, ED
Acid-base deficit: 3 mmol/L — ABNORMAL HIGH (ref 0.0–2.0)
Bicarbonate: 21 mmol/L (ref 20.0–28.0)
Calcium, Ion: 1.23 mmol/L (ref 1.15–1.40)
HCT: 53 % — ABNORMAL HIGH (ref 36.0–46.0)
Hemoglobin: 18 g/dL — ABNORMAL HIGH (ref 12.0–15.0)
O2 Saturation: 87 %
Potassium: 3.5 mmol/L (ref 3.5–5.1)
Sodium: 137 mmol/L (ref 135–145)
TCO2: 22 mmol/L (ref 22–32)
pCO2, Ven: 35.4 mmHg — ABNORMAL LOW (ref 44–60)
pH, Ven: 7.38 (ref 7.25–7.43)
pO2, Ven: 54 mmHg — ABNORMAL HIGH (ref 32–45)

## 2022-09-20 LAB — COMPREHENSIVE METABOLIC PANEL
ALT: 43 U/L (ref 0–44)
AST: 40 U/L (ref 15–41)
Albumin: 3.9 g/dL (ref 3.5–5.0)
Alkaline Phosphatase: 130 U/L — ABNORMAL HIGH (ref 38–126)
Anion gap: 10 (ref 5–15)
BUN: 8 mg/dL (ref 6–20)
CO2: 19 mmol/L — ABNORMAL LOW (ref 22–32)
Calcium: 9.1 mg/dL (ref 8.9–10.3)
Chloride: 107 mmol/L (ref 98–111)
Creatinine, Ser: 0.6 mg/dL (ref 0.44–1.00)
GFR, Estimated: >60 mL/min (ref >60)
Glucose, Bld: 238 mg/dL — ABNORMAL HIGH (ref 70–99)
Potassium: 3.4 mmol/L — ABNORMAL LOW (ref 3.5–5.1)
Sodium: 136 mmol/L (ref 135–145)
Total Bilirubin: 1 mg/dL (ref 0.3–1.2)
Total Protein: 8.2 g/dL — ABNORMAL HIGH (ref 6.5–8.1)

## 2022-09-20 LAB — PHOSPHORUS: Phosphorus: 3.5 mg/dL (ref 2.5–4.6)

## 2022-09-20 LAB — MAGNESIUM: Magnesium: 1.9 mg/dL (ref 1.7–2.4)

## 2022-09-20 LAB — SARS CORONAVIRUS 2 BY RT PCR: SARS Coronavirus 2 by RT PCR: NEGATIVE

## 2022-09-20 LAB — LIPASE, BLOOD: Lipase: 36 U/L (ref 11–51)

## 2022-09-20 LAB — CBG MONITORING, ED: Glucose-Capillary: 248 mg/dL — ABNORMAL HIGH (ref 70–99)

## 2022-09-20 MED ORDER — ONDANSETRON HCL 4 MG/2ML IJ SOLN
4.0000 mg | Freq: Once | INTRAMUSCULAR | Status: AC
  Administered 2022-09-20: 4 mg via INTRAVENOUS
  Filled 2022-09-20: qty 2

## 2022-09-20 MED ORDER — LOPERAMIDE HCL 2 MG PO CAPS
4.0000 mg | ORAL_CAPSULE | Freq: Once | ORAL | Status: AC
  Administered 2022-09-20: 4 mg via ORAL
  Filled 2022-09-20: qty 2

## 2022-09-20 MED ORDER — ONDANSETRON HCL 4 MG PO TABS: 4.0000 mg | ORAL_TABLET | Freq: Four times a day (QID) | ORAL | 0 refills | Status: DC

## 2022-09-20 MED ORDER — LACTATED RINGERS IV BOLUS
1000.0000 mL | Freq: Once | INTRAVENOUS | Status: AC
  Administered 2022-09-20: 1000 mL via INTRAVENOUS

## 2022-09-20 NOTE — ED Notes (Signed)
Pt's CBG 248. RN notified.

## 2022-09-20 NOTE — ED Notes (Signed)
Pt reports stopped diabetes medications because she was having adverse side effects such as bowel incontinence; she sts she has been feeling very fatigued

## 2022-09-20 NOTE — ED Provider Notes (Signed)
[facility] Provider Note   CSN: [id number] Arrival date & time: 09/20/22  8250     History  Chief Complaint  Patient presents with   Emesis    [facility] Norton is a 46 y.o. female, history of diabetes, who presents to the ED secondary to nausea, vomiting, diarrhea for the past 4 days.  She states that she has been off her diabetic medications for the past 2 months, secondary to severe nausea vomiting, diarrhea that was uncontrollable, decided that she has been feeling ill from her sugar, so she started taking her medications on Tuesday including Trulicity, glipizide, and Januvia.  Notes that on Wednesday morning she woke up and she started vomiting, has been vomiting 3-4 times a day since then having 9-10 episodes of diarrhea daily.  She states her symptoms are very similar to when she was on her medications, so after Tuesday she did not take any of her medications.  She states that the vomiting, diarrhea and nausea has continued.  Reports that she is still urinating no urinary symptoms, or abdominal pain.  Just feels weak and unwell.  Has not been taking her glucose at home.  Last A1c was 7.9% 2 months ago when she was on her medications.  Is fever, chills upper respiratory symptoms, redness breath or chest pain.     Home Medications Prior to Admission medications   Medication Sig Start Date End Date Taking? Authorizing Provider  ondansetron (ZOFRAN) 4 MG tablet Take 1 tablet (4 mg total) by mouth every 6 (six) hours. 09/20/22  Yes Sujata Maines L, PA  escitalopram (LEXAPRO) 20 MG tablet Take 20 mg by mouth daily.    [provider]  ezetimibe (ZETIA) 10 MG tablet Take 10 mg by mouth daily.    [provider]  GLIPIZIDE PO Take by mouth.    [provider]  ketoconazole (NIZORAL) 2 % cream Apply 1 application topically daily. 12/27/18   Kinnie Feil, PA-C  Lactobacillus (ACIDOPHILUS PROBIOTIC) 10 MG TABS Take 10 mg by mouth 3  (three) times daily. 08/21/15   Waynetta Pean, PA-C  metFORMIN (GLUCOPHAGE) 1000 MG tablet Take 1,000 mg by mouth 2 (two) times daily with a meal.    [provider]  nystatin (MYCOSTATIN) 100000 UNIT/ML suspension Take 5 mLs (500,000 Units total) by mouth 4 (four) times daily. Swish and swallow 04/13/18   Julianne Rice, MD  ondansetron (ZOFRAN ODT) 4 MG disintegrating tablet 4mg  ODT q4 hours prn nausea/vomit 04/13/18   Julianne Rice, MD  OVER THE COUNTER MEDICATION Take 1 tablet by mouth at bedtime. Heartburn medication    [provider]  oxyCODONE-acetaminophen (PERCOCET/ROXICET) 5-325 MG tablet Take 1-2 tablets by mouth every 8 (eight) hours as needed for severe pain. 03/29/19   Volanda Napoleon, PA-C  pantoprazole (PROTONIX) 20 MG tablet Take 1 tablet (20 mg total) by mouth daily. 04/13/18   Julianne Rice, MD  SITagliptin Phosphate (JANUVIA PO) Take by mouth.    [provider]      Allergies    Keflex [cephalexin], Metformin, Morphine and related, and Penicillins    Review of Systems   Review of Systems  Constitutional:  Negative for chills and fever.  Respiratory:  Negative for cough and shortness of breath.   Cardiovascular:  Negative for chest pain.  Gastrointestinal:  Positive for diarrhea, nausea and vomiting. Negative for abdominal pain, blood in stool and constipation.  Genitourinary:  Negative for dysuria, flank pain and urgency.  Physical Exam Updated Vital Signs BP (!) 125/91   Pulse 94   Temp 98.6 F (37 C) (Oral)   Resp 19   Ht 5' 5.5" (1.664 m)   Wt 104.3 kg   SpO2 96%   BMI 37.69 kg/m  Physical Exam HENT:     Mouth/Throat:     Mouth: Mucous membranes are dry.  Eyes:     Extraocular Movements: Extraocular movements intact.     Pupils: Pupils are equal, round, and reactive to light.  Cardiovascular:     Rate and Rhythm: Regular rhythm. Tachycardia present.  Pulmonary:     Effort: Pulmonary effort is normal.     Breath  sounds: Normal breath sounds.  Abdominal:     General: Abdomen is flat.     Palpations: Abdomen is soft.  Musculoskeletal:        General: Normal range of motion.  Skin:    General: Skin is warm and dry.     Capillary Refill: Capillary refill takes less than 2 seconds.  Neurological:     General: No focal deficit present.     Mental Status: She is alert and oriented to person, place, and time.  Psychiatric:        Mood and Affect: Affect is tearful.     ED Results / Procedures / Treatments   Labs (all labs ordered are listed, but only abnormal results are displayed) Labs Reviewed  COMPREHENSIVE METABOLIC PANEL - Abnormal; Notable for the following components:      Result Value   Potassium 3.4 (*)    CO2 19 (*)    Glucose, Bld 238 (*)    Total Protein 8.2 (*)    Alkaline Phosphatase 130 (*)    All other components within normal limits  CBC WITH DIFFERENTIAL/PLATELET - Abnormal; Notable for the following components:   WBC 12.4 (*)    RBC 5.45 (*)    Hemoglobin 17.9 (*)    HCT 51.5 (*)    Neutro Abs 8.3 (*)    All other components within normal limits  CBG MONITORING, ED - Abnormal; Notable for the following components:   Glucose-Capillary 248 (*)    All other components within normal limits  I-STAT VENOUS BLOOD GAS, ED - Abnormal; Notable for the following components:   pCO2, Ven 35.4 (*)    pO2, Ven 54 (*)    Acid-base deficit 3.0 (*)    HCT 53.0 (*)    Hemoglobin 18.0 (*)    All other components within normal limits  SARS CORONAVIRUS 2 BY RT PCR  MAGNESIUM  PHOSPHORUS  LIPASE, BLOOD  CBG MONITORING, ED    EKG EKG Interpretation  Date/Time:  Saturday September 20 2022 11:27:59 EDT Ventricular Rate:  97 PR Interval:  147 QRS Duration: 91 QT Interval:  354 QTC Calculation: 450 R Axis:   90 Text Interpretation: Sinus rhythm Consider right atrial enlargement Borderline right axis deviation Borderline repolarization abnormality Baseline wander in lead(s) II III  aVL aVF V1 No significant change since last tracing Confirmed by Vanetta Mulders [phone]) on 09/20/2022 11:35:25 AM  Radiology DG Abdomen 1 View  Result Date: 09/20/2022 CLINICAL DATA:  Abdominal pain and vomiting. EXAM: ABDOMEN - 1 VIEW COMPARISON:  04/13/2018 CT FINDINGS: The bowel gas pattern is normal. No radio-opaque calculi or other significant radiographic abnormality are seen. Cholecystectomy clips are again noted. IMPRESSION: Negative. Electronically Signed   By: Harmon Pier M.D.   On: 09/20/2022 13:21    Procedures Procedures  Medications Ordered in ED Medications  loperamide (IMODIUM) capsule 4 mg (has no administration in time range)  lactated ringers bolus 1,000 mL ( Intravenous Stopped 09/20/22 1246)  ondansetron (ZOFRAN) injection 4 mg (4 mg Intravenous Given 09/20/22 1144)    ED Course/ Medical Decision Making/ A&P                           Medical Decision Making Patient is a 46 year old female, history of diabetes, has not been taking her meds for 2 months secondary to severe nausea, vomiting, diarrhea with the meds, restarted them on Tuesday, and began vomiting on Wednesday.  Denies any sick contacts.  Notes she has not been taking her sugars.  Denies any chest pain or shortness of breath.  States her symptoms are very similar to when she was on her medications, has not taken her medication since Tuesday after she initially restarted it.  Denies any abdominal pain.  Will obtain basic labs, and VBG secondary to known diabetes that has been on treated for the last 2 months to further evaluate for acidosis.  We will also give IV fluids and Zofran and obtain EKG to evaluate QTc interval and need for avoidance of QT prolonging agents.  Amount and/or Complexity of Data Reviewed Labs: ordered.    Details: Labs leukocytosis, mild elevated alk phos, no abdominal pain however.  Leukocytosis likely secondary to vomiting. Radiology: ordered.    Details: Reviewed KUB nonobstructive  bowel pattern. Discussion of management or test interpretation with external provider(s): Patient is a 46 year old female, here for nausea, vomiting, diarrhea for the last 4 days.  She had just recently restarted her medications for diabetes, and has had symptoms previous please similar to this.  She does not have an elevated anion gap, and her VBG did not show acidosis, sugars only 248--she does not appear to be in DKA.  Likely symptoms are secondary to medications that induce nausea/vomiting, discussed follow-up with PCP, need to start on diabetic medications for control of diabetes, however may need to alter meds.  Offered Zofran for home, she voiced understanding.  Tachycardia and nausea improved while she was in the ER, and stabilized after IV fluids and Zofran.  No electrolyte abnormalities.  Return precautions emphasized.  Risk Prescription drug management.   Final Clinical Impression(s) / ED Diagnoses Final diagnoses:  Nausea vomiting and diarrhea    Rx / DC Orders ED Discharge Orders          Ordered    ondansetron (ZOFRAN) 4 MG tablet  Every 6 hours        09/20/22 1324              Lanett Lasorsa Carlean Jews, PA 09/20/22 1330    Fredia Sorrow, MD 09/21/22 0800

## 2022-09-20 NOTE — Discharge Instructions (Addendum)
Please follow-up with your primary care doctor for further evaluation, your diabetic drugs may need to be adjusted secondary to your profuse nausea, vomiting, diarrhea with them.  Take Zofran as needed for the nausea.  If you are unable to make urine, or tolerate p.o. intake please return to the ER.  Monitor your sugars closely, and if you begin feeling more weak, ill, nauseated, or vomiting and have a high sugar please return to the ER.  It is essential that you call your primary care doctor on Monday for this.

## 2022-09-20 NOTE — ED Triage Notes (Signed)
Patient c/o vomiting since Wednesday. No other household members with similar symptoms. Patient has not been taking her home medications for a couple of months. Patient started back on all home medications on Tuesday.

## 2022-10-05 ENCOUNTER — Encounter (HOSPITAL_BASED_OUTPATIENT_CLINIC_OR_DEPARTMENT_OTHER): Payer: Self-pay | Admitting: Emergency Medicine

## 2022-10-05 ENCOUNTER — Other Ambulatory Visit: Payer: Self-pay

## 2022-10-05 ENCOUNTER — Encounter (HOSPITAL_COMMUNITY): Payer: Self-pay

## 2022-10-05 ENCOUNTER — Inpatient Hospital Stay (HOSPITAL_BASED_OUTPATIENT_CLINIC_OR_DEPARTMENT_OTHER)
Admission: EM | Admit: 2022-10-05 | Discharge: 2022-10-08 | DRG: 872 | Disposition: A | Discharge status: Home / Self Care | Payer: BLUE CROSS/BLUE SHIELD | Attending: Internal Medicine | Admitting: Internal Medicine

## 2022-10-05 DIAGNOSIS — F419 Anxiety disorder, unspecified: Secondary | ICD-10-CM | POA: Diagnosis present

## 2022-10-05 DIAGNOSIS — E669 Obesity, unspecified: Secondary | ICD-10-CM | POA: Insufficient documentation

## 2022-10-05 DIAGNOSIS — D72829 Elevated white blood cell count, unspecified: Secondary | ICD-10-CM | POA: Insufficient documentation

## 2022-10-05 DIAGNOSIS — L02213 Cutaneous abscess of chest wall: Secondary | ICD-10-CM | POA: Diagnosis present

## 2022-10-05 DIAGNOSIS — L02211 Cutaneous abscess of abdominal wall: Secondary | ICD-10-CM | POA: Diagnosis present

## 2022-10-05 DIAGNOSIS — Z888 Allergy status to other drugs, medicaments and biological substances status: Secondary | ICD-10-CM

## 2022-10-05 DIAGNOSIS — F32A Depression, unspecified: Secondary | ICD-10-CM | POA: Diagnosis present

## 2022-10-05 DIAGNOSIS — Z794 Long term (current) use of insulin: Secondary | ICD-10-CM

## 2022-10-05 DIAGNOSIS — F4310 Post-traumatic stress disorder, unspecified: Secondary | ICD-10-CM | POA: Diagnosis present

## 2022-10-05 DIAGNOSIS — L02412 Cutaneous abscess of left axilla: Secondary | ICD-10-CM | POA: Diagnosis present

## 2022-10-05 DIAGNOSIS — Z91128 Patient's intentional underdosing of medication regimen for other reason: Secondary | ICD-10-CM

## 2022-10-05 DIAGNOSIS — E7800 Pure hypercholesterolemia, unspecified: Secondary | ICD-10-CM | POA: Diagnosis present

## 2022-10-05 DIAGNOSIS — Z880 Allergy status to penicillin: Secondary | ICD-10-CM

## 2022-10-05 DIAGNOSIS — F17210 Nicotine dependence, cigarettes, uncomplicated: Secondary | ICD-10-CM | POA: Diagnosis present

## 2022-10-05 DIAGNOSIS — E871 Hypo-osmolality and hyponatremia: Secondary | ICD-10-CM | POA: Insufficient documentation

## 2022-10-05 DIAGNOSIS — E1165 Type 2 diabetes mellitus with hyperglycemia: Secondary | ICD-10-CM | POA: Insufficient documentation

## 2022-10-05 DIAGNOSIS — Z6837 Body mass index (BMI) 37.0-37.9, adult: Secondary | ICD-10-CM

## 2022-10-05 DIAGNOSIS — A419 Sepsis, unspecified organism: Secondary | ICD-10-CM | POA: Diagnosis not present

## 2022-10-05 DIAGNOSIS — Z885 Allergy status to narcotic agent status: Secondary | ICD-10-CM

## 2022-10-05 DIAGNOSIS — Z90710 Acquired absence of both cervix and uterus: Secondary | ICD-10-CM

## 2022-10-05 DIAGNOSIS — K219 Gastro-esophageal reflux disease without esophagitis: Secondary | ICD-10-CM | POA: Diagnosis present

## 2022-10-05 DIAGNOSIS — T383X6A Underdosing of insulin and oral hypoglycemic [antidiabetic] drugs, initial encounter: Secondary | ICD-10-CM | POA: Diagnosis present

## 2022-10-05 DIAGNOSIS — Z79899 Other long term (current) drug therapy: Secondary | ICD-10-CM

## 2022-10-05 DIAGNOSIS — L02419 Cutaneous abscess of limb, unspecified: Secondary | ICD-10-CM | POA: Insufficient documentation

## 2022-10-05 DIAGNOSIS — Z8614 Personal history of Methicillin resistant Staphylococcus aureus infection: Secondary | ICD-10-CM

## 2022-10-05 DIAGNOSIS — E876 Hypokalemia: Secondary | ICD-10-CM | POA: Insufficient documentation

## 2022-10-05 DIAGNOSIS — R6520 Severe sepsis without septic shock: Secondary | ICD-10-CM | POA: Diagnosis present

## 2022-10-05 DIAGNOSIS — Z1152 Encounter for screening for COVID-19: Secondary | ICD-10-CM

## 2022-10-05 DIAGNOSIS — L732 Hidradenitis suppurativa: Secondary | ICD-10-CM | POA: Diagnosis present

## 2022-10-05 LAB — CBG MONITORING, ED
Glucose-Capillary: 395 mg/dL — ABNORMAL HIGH (ref 70–99)
Glucose-Capillary: 275 mg/dL — ABNORMAL HIGH (ref 70–99)

## 2022-10-05 LAB — COMPREHENSIVE METABOLIC PANEL
ALT: 31 U/L (ref 0–44)
AST: 29 U/L (ref 15–41)
Albumin: 3.1 g/dL — ABNORMAL LOW (ref 3.5–5.0)
Alkaline Phosphatase: 115 U/L (ref 38–126)
Anion gap: 11 (ref 5–15)
BUN: <5 mg/dL — ABNORMAL LOW (ref 6–20)
CO2: 20 mmol/L — ABNORMAL LOW (ref 22–32)
Calcium: 8.4 mg/dL — ABNORMAL LOW (ref 8.9–10.3)
Chloride: 101 mmol/L (ref 98–111)
Creatinine, Ser: 0.66 mg/dL (ref 0.44–1.00)
GFR, Estimated: >60 mL/min (ref >60)
Glucose, Bld: 388 mg/dL — ABNORMAL HIGH (ref 70–99)
Potassium: 3.3 mmol/L — ABNORMAL LOW (ref 3.5–5.1)
Sodium: 132 mmol/L — ABNORMAL LOW (ref 135–145)
Total Bilirubin: 0.7 mg/dL (ref 0.3–1.2)
Total Protein: 6.7 g/dL (ref 6.5–8.1)

## 2022-10-05 LAB — URINALYSIS, ROUTINE W REFLEX MICROSCOPIC
Bilirubin Urine: NEGATIVE
Glucose, UA: >=500 mg/dL — AB
Ketones, ur: NEGATIVE mg/dL
Leukocytes,Ua: NEGATIVE
Nitrite: NEGATIVE
Protein, ur: NEGATIVE mg/dL
Specific Gravity, Urine: 1.01 (ref 1.005–1.030)
pH: 7 (ref 5.0–8.0)

## 2022-10-05 LAB — CBC WITH DIFFERENTIAL/PLATELET
Abs Immature Granulocytes: 0.05 10*3/uL (ref 0.00–0.07)
Basophils Absolute: 0.1 10*3/uL (ref 0.0–0.1)
Basophils Relative: 1 %
Eosinophils Absolute: 0.2 10*3/uL (ref 0.0–0.5)
Eosinophils Relative: 1 %
HCT: 43.1 % (ref 36.0–46.0)
Hemoglobin: 15.3 g/dL — ABNORMAL HIGH (ref 12.0–15.0)
Immature Granulocytes: 0 %
Lymphocytes Relative: 25 %
Lymphs Abs: 3.3 10*3/uL (ref 0.7–4.0)
MCH: 32.9 pg (ref 26.0–34.0)
MCHC: 35.5 g/dL (ref 30.0–36.0)
MCV: 92.7 fL (ref 80.0–100.0)
Monocytes Absolute: 1 10*3/uL (ref 0.1–1.0)
Monocytes Relative: 8 %
Neutro Abs: 8.6 10*3/uL — ABNORMAL HIGH (ref 1.7–7.7)
Neutrophils Relative %: 65 %
Platelets: 246 10*3/uL (ref 150–400)
RBC: 4.65 MIL/uL (ref 3.87–5.11)
RDW: 12.1 % (ref 11.5–15.5)
WBC: 13.2 10*3/uL — ABNORMAL HIGH (ref 4.0–10.5)
nRBC: 0 % (ref 0.0–0.2)

## 2022-10-05 LAB — URINALYSIS, MICROSCOPIC (REFLEX)

## 2022-10-05 LAB — PROTIME-INR
INR: 1.1 (ref 0.8–1.2)
Prothrombin Time: 13.8 seconds (ref 11.4–15.2)

## 2022-10-05 LAB — LACTIC ACID, PLASMA: Lactic Acid, Venous: 3.1 mmol/L (ref 0.5–1.9)

## 2022-10-05 LAB — SARS CORONAVIRUS 2 BY RT PCR: SARS Coronavirus 2 by RT PCR: NEGATIVE

## 2022-10-05 MED ORDER — ACETAMINOPHEN 500 MG PO TABS
1000.0000 mg | ORAL_TABLET | Freq: Once | ORAL | Status: AC
  Administered 2022-10-05: 1000 mg via ORAL
  Filled 2022-10-05: qty 2

## 2022-10-05 MED ORDER — INSULIN ASPART PROT & ASPART (70-30 MIX) 100 UNIT/ML ~~LOC~~ SUSP
6.0000 [IU] | Freq: Once | SUBCUTANEOUS | Status: AC
  Administered 2022-10-05: 6 [IU] via SUBCUTANEOUS
  Filled 2022-10-05: qty 6

## 2022-10-05 MED ORDER — SODIUM CHLORIDE 0.9 % IV SOLN
2.0000 g | Freq: Once | INTRAVENOUS | Status: AC
  Administered 2022-10-05: 2 g via INTRAVENOUS
  Filled 2022-10-05: qty 12.5

## 2022-10-05 MED ORDER — FENTANYL CITRATE PF 50 MCG/ML IJ SOSY
50.0000 ug | PREFILLED_SYRINGE | INTRAMUSCULAR | Status: DC | PRN
  Administered 2022-10-05: 50 ug via INTRAVENOUS
  Filled 2022-10-05: qty 1

## 2022-10-05 MED ORDER — DIPHENHYDRAMINE HCL 50 MG/ML IJ SOLN
50.0000 mg | Freq: Once | INTRAMUSCULAR | Status: AC
  Administered 2022-10-05: 50 mg via INTRAVENOUS
  Filled 2022-10-05: qty 1

## 2022-10-05 MED ORDER — LACTATED RINGERS IV BOLUS
2000.0000 mL | Freq: Once | INTRAVENOUS | Status: AC
  Administered 2022-10-05: 1000 mL via INTRAVENOUS

## 2022-10-05 NOTE — ED Triage Notes (Signed)
Pt arrives pov, steady gait, c/o multiple abscess - LT foot, RT axilla, LT side of head, vagina, RT flank, and endorses bleeding from umbilicus. Febrile in triage

## 2022-10-05 NOTE — ED Notes (Signed)
EDP notified of Lactic 3.1. Primary RN already aware and fluids already infusing.

## 2022-10-05 NOTE — ED Provider Notes (Signed)
[facility] EMERGENCY DEPARTMENT Provider Note   CSN: [id number] Arrival date & time: 10/05/22  1800     History Chief Complaint  Patient presents with   Abscess    HPI Breanna Norton is a 46 y.o. female presenting for chief complaint of fever and multiple skin abscesses.  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  She states that she has a large abscess under her axilla that will not drain and there is been spreading erythema warmth from it.  She denies fevers or chills, nausea or vomiting, syncope shortness of breath.  She is otherwise ambulatory tolerating p.o. intake..  Patient's recorded medical, surgical, social, medication list and allergies were reviewed in the Snapshot window as part of the initial history.   Review of Systems   Review of Systems  Constitutional:  Positive for fever. Negative for chills.  HENT:  Negative for ear pain and sore throat.   Eyes:  Negative for pain and visual disturbance.  Respiratory:  Negative for cough and shortness of breath.   Cardiovascular:  Negative for chest pain and palpitations.  Gastrointestinal:  Negative for abdominal pain and vomiting.  Genitourinary:  Negative for dysuria and hematuria.  Musculoskeletal:  Negative for arthralgias and back pain.  Skin:  Positive for pallor and wound. Negative for color change and rash.  Neurological:  Negative for seizures and syncope.  All other systems reviewed and are negative.   Physical Exam Updated Vital Signs BP 114/78   Pulse 87   Temp 98.2 F (36.8 C) (Oral)   Resp (!) 24   Ht 5' 5.5" (1.664 m)   Wt 104.3 kg   SpO2 96%   BMI 37.69 kg/m  Physical Exam Vitals and nursing note reviewed.  Constitutional:      General: She is not in acute distress.    Appearance: She is well-developed.  HENT:     Head: Normocephalic and atraumatic.  Eyes:     Conjunctiva/sclera: Conjunctivae normal.  Cardiovascular:     Rate and Rhythm: Normal rate and  regular rhythm.     Heart sounds: No murmur heard. Pulmonary:     Effort: Pulmonary effort is normal. No respiratory distress.     Breath sounds: Normal breath sounds.  Abdominal:     General: There is no distension.     Palpations: Abdomen is soft.     Tenderness: There is no abdominal tenderness. There is no right CVA tenderness or left CVA tenderness.  Musculoskeletal:        General: Deformity (Multiple diffuse abscesses appreciated, left axilla, groin, anterior abdominal wall.) and signs of injury present. No swelling or tenderness. Normal range of motion.     Cervical back: Neck supple.  Skin:    General: Skin is warm and dry.  Neurological:     General: No focal deficit present.     Mental Status: She is alert and oriented to person, place, and time. Mental status is at baseline.     Cranial Nerves: No cranial nerve deficit.      ED Course/ Medical Decision Making/ A&P    Procedures .Critical Care  Performed by: Glyn Ade, MD Authorized by: Glyn Ade, MD   Critical care provider statement:    Critical care time (minutes):  30   Critical care was necessary to treat or prevent imminent or life-threatening deterioration of the following conditions:  Sepsis and shock   Critical care was time spent personally by me on  the following activities:  Development of treatment plan with patient or surrogate, discussions with consultants, evaluation of patient's response to treatment, examination of patient, ordering and review of laboratory studies, ordering and review of radiographic studies, ordering and performing treatments and interventions, pulse oximetry, re-evaluation of patient's condition and review of old charts    Medications Ordered in ED Medications  fentaNYL (SUBLIMAZE) injection 50 mcg (50 mcg Intravenous Given 10/05/22 2004)  lactated ringers bolus 2,000 mL (0 mLs Intravenous Stopped 10/05/22 2015)  acetaminophen (TYLENOL) tablet 1,000 mg (1,000 mg  Oral Given 10/05/22 1842)  ceFEPIme (MAXIPIME) 2 g in sodium chloride 0.9 % 100 mL IVPB (0 g Intravenous Stopped 10/05/22 2015)  diphenhydrAMINE (BENADRYL) injection 50 mg (50 mg Intravenous Given 10/05/22 1858)  insulin aspart protamine- aspart (NOVOLOG MIX 70/30) injection 6 Units (6 Units Subcutaneous Given 10/05/22 2004)    Medical Decision Making:    Breanna Norton is a 46 y.o. female who presented to the ED today with fever, multiple abscesses and now developing fatigue detailed above.     Handoff received from EMS.  Patient's presentation is complicated by their history of multiple comorbid medical problems.  Patient placed on continuous vitals and telemetry monitoring while in ED which was reviewed periodically.   Complete initial physical exam performed, notably the patient  was hemodynamically stable in no acute distress.  She is tachycardic and febrile on arrivalRaising concern for developing sepsis triggering sepsis protocol.      Reviewed and confirmed nursing documentation for past medical history, family history, social history.    Initial Assessment:   With the patient's presentation of fever and tachycardia, most likely diagnosis is sepsis from patient's cellulitic disease with concern for recurrent MRSA bacteremia. Other diagnoses were considered including (but not limited to) urinary tract infection, pneumonia, pneumothorax, pulmonary embolism. These are considered less likely due to history of present illness and physical exam findings.   This is most consistent with an acute life/limb threatening illness complicated by underlying chronic conditions.  Initial Plan:  Sepsis protocol activated including IV fluids, coverage for bacteremia including MRSA bacteremia given patient's history with MRSA coverage Screening labs including CBC and Metabolic panel to evaluate for infectious or metabolic etiology of disease.  Urinalysis with reflex culture ordered to evaluate for UTI or  relevant urologic/nephrologic pathology.  EKG to evaluate for cardiac pathology. Objective evaluation as below reviewed with plan for close reassessment  Initial Study Results:   Laboratory  Multiple abnormalities including lactic acid acidosis, leukocytosis  EKG EKG was reviewed independently. Rate, rhythm, axis, intervals all examined and without medically relevant abnormality. ST segments without concerns for elevations.    Final Assessment and Plan:   Patient with signs of sepsis, being treated at this time.  Likely secondary to her cellulitis surrounding her hidradenitis.  Blood cultures are pending, concern for MRSA bacteremia patient started on vancomycin and broad-spectrum antibiotics.  Patient arranged for admission at main campus with no indication for further event here in the emergency department.  Patient required close management of her symptoms while here in the emergency department.    Clinical Impression: No diagnosis found.   Admit   Final Clinical Impression(s) / ED Diagnoses Final diagnoses:  None    Rx / DC Orders ED Discharge Orders     None         Tretha Sciara, MD 10/05/22 2237

## 2022-10-06 ENCOUNTER — Encounter (HOSPITAL_BASED_OUTPATIENT_CLINIC_OR_DEPARTMENT_OTHER): Payer: Self-pay | Admitting: Internal Medicine

## 2022-10-06 DIAGNOSIS — A419 Sepsis, unspecified organism: Principal | ICD-10-CM

## 2022-10-06 DIAGNOSIS — R6520 Severe sepsis without septic shock: Secondary | ICD-10-CM | POA: Diagnosis present

## 2022-10-06 DIAGNOSIS — F17210 Nicotine dependence, cigarettes, uncomplicated: Secondary | ICD-10-CM | POA: Diagnosis present

## 2022-10-06 DIAGNOSIS — E871 Hypo-osmolality and hyponatremia: Secondary | ICD-10-CM | POA: Diagnosis not present

## 2022-10-06 DIAGNOSIS — E876 Hypokalemia: Secondary | ICD-10-CM | POA: Diagnosis present

## 2022-10-06 DIAGNOSIS — E1165 Type 2 diabetes mellitus with hyperglycemia: Secondary | ICD-10-CM | POA: Diagnosis present

## 2022-10-06 DIAGNOSIS — K219 Gastro-esophageal reflux disease without esophagitis: Secondary | ICD-10-CM | POA: Diagnosis present

## 2022-10-06 DIAGNOSIS — D72829 Elevated white blood cell count, unspecified: Secondary | ICD-10-CM | POA: Diagnosis not present

## 2022-10-06 DIAGNOSIS — Z880 Allergy status to penicillin: Secondary | ICD-10-CM | POA: Diagnosis not present

## 2022-10-06 DIAGNOSIS — Z6837 Body mass index (BMI) 37.0-37.9, adult: Secondary | ICD-10-CM | POA: Diagnosis not present

## 2022-10-06 DIAGNOSIS — Z79899 Other long term (current) drug therapy: Secondary | ICD-10-CM | POA: Diagnosis not present

## 2022-10-06 DIAGNOSIS — E7800 Pure hypercholesterolemia, unspecified: Secondary | ICD-10-CM | POA: Diagnosis present

## 2022-10-06 DIAGNOSIS — Z1152 Encounter for screening for COVID-19: Secondary | ICD-10-CM | POA: Diagnosis not present

## 2022-10-06 DIAGNOSIS — Z794 Long term (current) use of insulin: Secondary | ICD-10-CM | POA: Diagnosis not present

## 2022-10-06 DIAGNOSIS — L02412 Cutaneous abscess of left axilla: Secondary | ICD-10-CM | POA: Diagnosis present

## 2022-10-06 DIAGNOSIS — E669 Obesity, unspecified: Secondary | ICD-10-CM | POA: Diagnosis present

## 2022-10-06 DIAGNOSIS — T383X6A Underdosing of insulin and oral hypoglycemic [antidiabetic] drugs, initial encounter: Secondary | ICD-10-CM | POA: Diagnosis present

## 2022-10-06 DIAGNOSIS — L02419 Cutaneous abscess of limb, unspecified: Secondary | ICD-10-CM | POA: Diagnosis not present

## 2022-10-06 DIAGNOSIS — Z885 Allergy status to narcotic agent status: Secondary | ICD-10-CM | POA: Diagnosis not present

## 2022-10-06 DIAGNOSIS — F419 Anxiety disorder, unspecified: Secondary | ICD-10-CM | POA: Diagnosis present

## 2022-10-06 DIAGNOSIS — F4310 Post-traumatic stress disorder, unspecified: Secondary | ICD-10-CM | POA: Diagnosis present

## 2022-10-06 DIAGNOSIS — Z91128 Patient's intentional underdosing of medication regimen for other reason: Secondary | ICD-10-CM | POA: Diagnosis not present

## 2022-10-06 DIAGNOSIS — L732 Hidradenitis suppurativa: Secondary | ICD-10-CM | POA: Diagnosis present

## 2022-10-06 DIAGNOSIS — L02213 Cutaneous abscess of chest wall: Secondary | ICD-10-CM | POA: Diagnosis present

## 2022-10-06 DIAGNOSIS — L02211 Cutaneous abscess of abdominal wall: Secondary | ICD-10-CM | POA: Diagnosis present

## 2022-10-06 DIAGNOSIS — F32A Depression, unspecified: Secondary | ICD-10-CM | POA: Diagnosis present

## 2022-10-06 LAB — CBC WITH DIFFERENTIAL/PLATELET
Abs Immature Granulocytes: 0.03 10*3/uL (ref 0.00–0.07)
Basophils Absolute: 0.1 10*3/uL (ref 0.0–0.1)
Basophils Relative: 1 %
Eosinophils Absolute: 0.1 10*3/uL (ref 0.0–0.5)
Eosinophils Relative: 2 %
HCT: 42 % (ref 36.0–46.0)
Hemoglobin: 14.8 g/dL (ref 12.0–15.0)
Immature Granulocytes: 0 %
Lymphocytes Relative: 17 %
Lymphs Abs: 1.5 10*3/uL (ref 0.7–4.0)
MCH: 32.8 pg (ref 26.0–34.0)
MCHC: 35.2 g/dL (ref 30.0–36.0)
MCV: 93.1 fL (ref 80.0–100.0)
Monocytes Absolute: 0.7 10*3/uL (ref 0.1–1.0)
Monocytes Relative: 8 %
Neutro Abs: 6.4 10*3/uL (ref 1.7–7.7)
Neutrophils Relative %: 72 %
Platelets: 169 10*3/uL (ref 150–400)
RBC: 4.51 MIL/uL (ref 3.87–5.11)
RDW: 12.3 % (ref 11.5–15.5)
WBC: 8.8 10*3/uL (ref 4.0–10.5)
nRBC: 0 % (ref 0.0–0.2)

## 2022-10-06 LAB — LACTIC ACID, PLASMA: Lactic Acid, Venous: 1.7 mmol/L (ref 0.5–1.9)

## 2022-10-06 LAB — MRSA NEXT GEN BY PCR, NASAL: MRSA by PCR Next Gen: NOT DETECTED

## 2022-10-06 LAB — CBG MONITORING, ED
Glucose-Capillary: 388 mg/dL — ABNORMAL HIGH (ref 70–99)
Glucose-Capillary: 385 mg/dL — ABNORMAL HIGH (ref 70–99)
Glucose-Capillary: 285 mg/dL — ABNORMAL HIGH (ref 70–99)
Glucose-Capillary: 220 mg/dL — ABNORMAL HIGH (ref 70–99)

## 2022-10-06 LAB — HEMOGLOBIN A1C
Hgb A1c MFr Bld: 10.5 % — ABNORMAL HIGH (ref 4.8–5.6)
Mean Plasma Glucose: 254.65 mg/dL

## 2022-10-06 LAB — GLUCOSE, CAPILLARY: Glucose-Capillary: 372 mg/dL — ABNORMAL HIGH (ref 70–99)

## 2022-10-06 MED ORDER — INSULIN ASPART 100 UNIT/ML IJ SOLN
0.0000 [IU] | Freq: Three times a day (TID) | INTRAMUSCULAR | Status: DC
  Administered 2022-10-06: 5 [IU] via SUBCUTANEOUS
  Administered 2022-10-06: 15 [IU] via SUBCUTANEOUS

## 2022-10-06 MED ORDER — ESCITALOPRAM OXALATE 20 MG PO TABS
20.0000 mg | ORAL_TABLET | Freq: Every day | ORAL | Status: DC
  Administered 2022-10-06 – 2022-10-08 (×3): 20 mg via ORAL
  Filled 2022-10-06 (×3): qty 1

## 2022-10-06 MED ORDER — LACTATED RINGERS IV BOLUS
30.0000 mL/kg | Freq: Once | INTRAVENOUS | Status: AC
  Administered 2022-10-06: 1000 mL via INTRAVENOUS

## 2022-10-06 MED ORDER — INSULIN ASPART 100 UNIT/ML IJ SOLN
0.0000 [IU] | Freq: Three times a day (TID) | INTRAMUSCULAR | Status: DC
  Administered 2022-10-07: 4 [IU] via SUBCUTANEOUS
  Administered 2022-10-07 – 2022-10-08 (×2): 15 [IU] via SUBCUTANEOUS
  Administered 2022-10-08: 11 [IU] via SUBCUTANEOUS

## 2022-10-06 MED ORDER — ENOXAPARIN SODIUM 60 MG/0.6ML IJ SOSY
50.0000 mg | PREFILLED_SYRINGE | INTRAMUSCULAR | Status: DC
  Administered 2022-10-06 – 2022-10-07 (×2): 50 mg via SUBCUTANEOUS
  Filled 2022-10-06 (×2): qty 0.6

## 2022-10-06 MED ORDER — POTASSIUM CHLORIDE CRYS ER 20 MEQ PO TBCR
40.0000 meq | EXTENDED_RELEASE_TABLET | Freq: Once | ORAL | Status: AC
  Administered 2022-10-06: 40 meq via ORAL
  Filled 2022-10-06: qty 2

## 2022-10-06 MED ORDER — BACID PO TABS: 2.0000 | ORAL_TABLET | Freq: Three times a day (TID) | ORAL | Status: DC

## 2022-10-06 MED ORDER — VANCOMYCIN HCL IN DEXTROSE 1-5 GM/200ML-% IV SOLN
1000.0000 mg | Freq: Two times a day (BID) | INTRAVENOUS | Status: DC
  Administered 2022-10-06 – 2022-10-08 (×4): 1000 mg via INTRAVENOUS
  Filled 2022-10-06 (×5): qty 200

## 2022-10-06 MED ORDER — VANCOMYCIN HCL 1500 MG/300ML IV SOLN
1500.0000 mg | Freq: Two times a day (BID) | INTRAVENOUS | Status: DC
  Filled 2022-10-06: qty 300

## 2022-10-06 MED ORDER — SODIUM CHLORIDE 0.9 % IV SOLN
2.0000 g | Freq: Three times a day (TID) | INTRAVENOUS | Status: DC
  Administered 2022-10-06 (×2): 2 g via INTRAVENOUS
  Filled 2022-10-06 (×2): qty 12.5

## 2022-10-06 MED ORDER — INSULIN ASPART 100 UNIT/ML IJ SOLN
0.0000 [IU] | Freq: Every day | INTRAMUSCULAR | Status: DC
  Administered 2022-10-06 – 2022-10-07 (×2): 5 [IU] via SUBCUTANEOUS

## 2022-10-06 MED ORDER — METRONIDAZOLE 500 MG/100ML IV SOLN: 500.0000 mg | Freq: Once | INTRAVENOUS | Status: DC

## 2022-10-06 MED ORDER — RISAQUAD PO CAPS
1.0000 | ORAL_CAPSULE | Freq: Every day | ORAL | Status: DC
  Administered 2022-10-06 – 2022-10-08 (×3): 1 via ORAL
  Filled 2022-10-06 (×3): qty 1

## 2022-10-06 MED ORDER — KETOROLAC TROMETHAMINE 30 MG/ML IJ SOLN
30.0000 mg | Freq: Four times a day (QID) | INTRAMUSCULAR | Status: DC | PRN
  Administered 2022-10-06 – 2022-10-07 (×3): 30 mg via INTRAVENOUS
  Filled 2022-10-06 (×3): qty 1

## 2022-10-06 MED ORDER — INSULIN ASPART 100 UNIT/ML IJ SOLN: 0.0000 [IU] | Freq: Every day | INTRAMUSCULAR | Status: DC

## 2022-10-06 MED ORDER — SODIUM CHLORIDE 0.9 % IV SOLN: 2.0000 g | Freq: Two times a day (BID) | INTRAVENOUS | Status: DC

## 2022-10-06 MED ORDER — VANCOMYCIN HCL IN DEXTROSE 1-5 GM/200ML-% IV SOLN
1000.0000 mg | INTRAVENOUS | Status: AC
  Administered 2022-10-06 (×2): 1000 mg via INTRAVENOUS
  Filled 2022-10-06 (×2): qty 200

## 2022-10-06 MED ORDER — INSULIN GLARGINE-YFGN 100 UNIT/ML ~~LOC~~ SOLN
15.0000 [IU] | Freq: Every day | SUBCUTANEOUS | Status: DC
  Administered 2022-10-06: 15 [IU] via SUBCUTANEOUS
  Filled 2022-10-06 (×2): qty 0.15

## 2022-10-06 MED ORDER — NICOTINE 14 MG/24HR TD PT24
14.0000 mg | MEDICATED_PATCH | Freq: Once | TRANSDERMAL | Status: AC
  Administered 2022-10-06: 14 mg via TRANSDERMAL
  Filled 2022-10-06: qty 1

## 2022-10-06 MED ORDER — METRONIDAZOLE 500 MG/100ML IV SOLN
500.0000 mg | Freq: Two times a day (BID) | INTRAVENOUS | Status: DC
  Administered 2022-10-06: 500 mg via INTRAVENOUS
  Filled 2022-10-06: qty 100

## 2022-10-06 NOTE — Progress Notes (Signed)
Pharmacy Antibiotic Note  Breanna Norton is a 46 y.o. female admitted on 10/05/2022 with cellulitis and skin abscesses.  Pharmacy has been consulted for  Vancomycin dosing.  Plan: Vancomycin 2000 mg IV now, then Vancomycin 1500 mg IV q12h  Height: 5' 5.5" (166.4 cm) Weight: 104.3 kg (230 lb) IBW/kg (Calculated) : 58.15  Temp (24hrs), Avg:98.9 F (37.2 C), Min:98.2 F (36.8 C), Max:100.4 F (38 C)  Recent Labs  Lab 10/05/22 1830 10/05/22 1847 10/06/22 0438  WBC 13.2*  --   --   CREATININE  --  0.66  --   LATICACIDVEN 3.1*  --  1.7    Estimated Creatinine Clearance: 108.5 mL/min (by C-G formula based on SCr of 0.66 mg/dL).    Allergies  Allergen Reactions   Keflex [Cephalexin] Hives   Metformin Diarrhea   Morphine And Related Other (See Comments)    Blacked out   Penicillins Hives     Caryl Pina 10/06/2022 5:36 AM

## 2022-10-06 NOTE — H&P (View-Only) (Signed)
Reason for Consult:left axillary abscess Referring Physician: [name] is an 46 y.o. female.  HPI: 46yo F with PMHx DM and hidradenitis suppurativa presented to [facility] with a left axillary abscess.  She was admitted by the hospitalist service and has been placed on IV vancomycin and cefepime.  I was asked to see her in consultation regarding this left axillary abscess.  She has a history of multiple subcutaneous abscesses associated with her hidradenitis.  These have required incision and drainage in the past.  She reports she has been feeling poorly for over a week in this area and her left axilla has gotten more swollen and red.  It has been draining greenish fluid.  She reports she recently also had some scalp abscesses but these have resolved.  Past Medical History:  Diagnosis Date   Diabetes mellitus without complication (HCC)    GERD (gastroesophageal reflux disease)    High cholesterol    PTSD (post-traumatic stress disorder)     Past Surgical History:  Procedure Laterality Date   ABDOMINAL HYSTERECTOMY     CHOLECYSTECTOMY     oopharectomy     TONSILLECTOMY      History reviewed. No pertinent family history.  Social History:  reports that she has been smoking. She has been smoking an average of 1 pack per day. She has never used smokeless tobacco. She reports that she does not drink alcohol and does not use drugs.  Allergies:  Allergies  Allergen Reactions   Keflex [Cephalexin] Hives    Tolerated cefepime 10/2022 admission   Metformin Diarrhea   Morphine And Related Other (See Comments)    Blacked out   Penicillins Hives    Tolerated cefepime 10/2022 admission    Medications: I have reviewed the patient's current medications.  Results for orders placed or performed during the hospital encounter of 10/05/22 (from the past 48 hour(s))  Lactic acid, plasma     Status: Abnormal   Collection Time: 10/05/22  6:30 PM  Result Value Ref Range    Lactic Acid, Venous 3.1 (HH) 0.5 - 1.9 mmol/L    Comment: CRITICAL RESULT CALLED TO, READ BACK BY AND VERIFIED WITH POWELL,V RN @1930  10/04/22 EDENSCA Performed at [facility], [address] [place], [place]   CBC with Differential     Status: Abnormal   Collection Time: 10/05/22  6:30 PM  Result Value Ref Range   WBC 13.2 (H) 4.0 - 10.5 K/uL   RBC 4.65 3.87 - 5.11 MIL/uL   Hemoglobin 15.3 (H) 12.0 - 15.0 g/dL   HCT 13/05/23 29.5 - 28.4 %   MCV 92.7 80.0 - 100.0 fL   MCH 32.9 26.0 - 34.0 pg   MCHC 35.5 30.0 - 36.0 g/dL   RDW 13.2 44.0 - 10.2 %   Platelets 246 150 - 400 K/uL   nRBC 0.0 0.0 - 0.2 %   Neutrophils Relative % 65 %   Neutro Abs 8.6 (H) 1.7 - 7.7 K/uL   Lymphocytes Relative 25 %   Lymphs Abs 3.3 0.7 - 4.0 K/uL   Monocytes Relative 8 %   Monocytes Absolute 1.0 0.1 - 1.0 K/uL   Eosinophils Relative 1 %   Eosinophils Absolute 0.2 0.0 - 0.5 K/uL   Basophils Relative 1 %   Basophils Absolute 0.1 0.0 - 0.1 K/uL   Immature Granulocytes 0 %   Abs Immature Granulocytes 0.05 0.00 - 0.07 K/uL    Comment: Performed  at [facility], [address] [place], [place] [address]  Culture, blood (Routine x 2)     Status: None (Preliminary result)   Collection Time: 10/05/22  6:30 PM   Specimen: BLOOD RIGHT HAND  Result Value Ref Range   Specimen Description      BLOOD RIGHT HAND Performed at [facility], [address] [place], [place] [address]    Special Requests      BOTTLES DRAWN AEROBIC AND ANAEROBIC Blood Culture adequate volume Performed at [facility], [address] [place], [place] [address]    Culture      NO GROWTH < 12 HOURS Performed at [facility], [address] [place], [place] [address]    Report Status PENDING   Urinalysis, Routine w reflex microscopic     Status: Abnormal   Collection Time: 10/05/22  6:30 PM  Result Value Ref Range   Color, Urine YELLOW YELLOW   APPearance HAZY (A) CLEAR    Specific Gravity, Urine 1.010 1.005 - 1.030   pH 7.0 5.0 - 8.0   Glucose, UA >=500 (A) NEGATIVE mg/dL   Hgb urine dipstick TRACE (A) NEGATIVE   Bilirubin Urine NEGATIVE NEGATIVE   Ketones, ur NEGATIVE NEGATIVE mg/dL   Protein, ur NEGATIVE NEGATIVE mg/dL   Nitrite NEGATIVE NEGATIVE   Leukocytes,Ua NEGATIVE NEGATIVE    Comment: Performed at [facility], [address] [place], [place] [address]  Urinalysis, Microscopic (reflex)     Status: Abnormal   Collection Time: 10/05/22  6:30 PM  Result Value Ref Range   RBC / HPF 0-5 0 - 5 RBC/hpf   WBC, UA 0-5 0 - 5 WBC/hpf   Bacteria, UA FEW (A) NONE SEEN   Squamous Epithelial / LPF 0-5 0 - 5   Budding Yeast PRESENT     Comment: Performed at [facility], [address] [place], [place] [address]  Culture, blood (Routine x 2)     Status: None (Preliminary result)   Collection Time: 10/05/22  6:32 PM   Specimen: BLOOD  Result Value Ref Range   Specimen Description      BLOOD RIGHT ANTECUBITAL Performed at [facility], [address] [place], [place] [address]    Special Requests      BOTTLES DRAWN AEROBIC AND ANAEROBIC Blood Culture adequate volume Performed at [facility], [address] [place], [place] [address]    Culture      NO GROWTH < 12 HOURS Performed at [facility], [address], [place], [place] [address]    Report Status PENDING   POC CBG, ED     Status: Abnormal   Collection Time: 10/05/22  6:44 PM  Result Value Ref Range   Glucose-Capillary 395 (H) 70 - 99 mg/dL    Comment: Glucose reference range applies only to samples taken after fasting for at least 8 hours.  SARS Coronavirus 2 by RT PCR (hospital order, performed in [facility] hospital lab) *cepheid single result test* Anterior Nasal Swab     Status: None   Collection Time: 10/05/22  6:47 PM   Specimen: Anterior Nasal Swab  Result Value Ref Range   SARS Coronavirus 2 by RT PCR NEGATIVE NEGATIVE     Comment: (NOTE) SARS-CoV-2 target nucleic acids are NOT DETECTED.  The SARS-CoV-2 RNA is generally detectable in upper and lower respiratory specimens during  the acute phase of infection. The lowest concentration of SARS-CoV-2 viral copies this assay can detect is 250 copies / mL. A negative result does not preclude SARS-CoV-2 infection and should not be used as the sole basis for treatment or other patient management decisions.  A negative result may occur with improper specimen collection / handling, submission of specimen other than nasopharyngeal swab, presence of viral mutation(s) within the areas targeted by this assay, and inadequate number of viral copies (<250 copies / mL). A negative result must be combined with clinical observations, patient history, and epidemiological information.  Fact Sheet for Patients:   https://www.fda.gov/media/158405/download  Fact Sheet for Healthcare Providers: https://www.fda.gov/media/158404/download  This test is not yet approved or  cleared by the [place] FDA and has been authorized for detection and/or diagnosis of SARS-CoV-2 by FDA under an Emergency Use Authorization (EUA).  This EUA will remain in effect (meaning this test can be used) for the duration of the COVID-19 declaration under Section 564(b)(1) of the Act, 21 U.S.C. section 360bbb-3(b)(1), unless the authorization is terminated or revoked sooner.  Performed at [facility] [place], [address] [place], [place] [address]   Comprehensive metabolic panel     Status: Abnormal   Collection Time: 10/05/22  6:47 PM  Result Value Ref Range   Sodium 132 (L) 135 - 145 mmol/L   Potassium 3.3 (L) 3.5 - 5.1 mmol/L   Chloride 101 98 - 111 mmol/L   CO2 20 (L) 22 - 32 mmol/L   Glucose, Bld 388 (H) 70 - 99 mg/dL    Comment: Glucose reference range applies only to samples taken after fasting for at least 8 hours.   BUN <5 (L) 6 - 20 mg/dL   Creatinine, Ser 0.66 0.44 - 1.00  mg/dL   Calcium 8.4 (L) 8.9 - 10.3 mg/dL   Total Protein 6.7 6.5 - 8.1 g/dL   Albumin 3.1 (L) 3.5 - 5.0 g/dL   AST 29 15 - 41 U/L   ALT 31 0 - 44 U/L   Alkaline Phosphatase 115 38 - 126 U/L   Total Bilirubin 0.7 0.3 - 1.2 mg/dL   GFR, Estimated >60 >60 mL/min    Comment: (NOTE) Calculated using the CKD-EPI Creatinine Equation (2021)    Anion gap 11 5 - 15    Comment: Performed at [facility] [place], [address] [place], [place] [address]  Protime-INR     Status: None   Collection Time: 10/05/22  8:03 PM  Result Value Ref Range   Prothrombin Time 13.8 11.4 - 15.2 seconds   INR 1.1 0.8 - 1.2    Comment: (NOTE) INR goal varies based on device and disease states. Performed at [facility] [place], [address] [place], [place] [address]   CBG monitoring, ED     Status: Abnormal   Collection Time: 10/05/22 10:55 PM  Result Value Ref Range   Glucose-Capillary 275 (H) 70 - 99 mg/dL    Comment: Glucose reference range applies only to samples taken after fasting for at least 8 hours.  Lactic acid, plasma     Status: None   Collection Time: 10/06/22  4:38 AM  Result Value Ref Range   Lactic Acid, Venous 1.7 0.5 - 1.9 mmol/L    Comment: Performed at [facility] [place], [address] [place], [place] [address]  CBG monitoring, ED     Status: Abnormal   Collection Time: 10/06/22  4:59 AM    Result Value Ref Range   Glucose-Capillary 388 (H) 70 - 99 mg/dL    Comment: Glucose reference range applies only to samples taken after fasting for at least 8 hours.  CBC with Differential     Status: None   Collection Time: 10/06/22  8:06 AM  Result Value Ref Range   WBC 8.8 4.0 - 10.5 K/uL   RBC 4.51 3.87 - 5.11 MIL/uL   Hemoglobin 14.8 12.0 - 15.0 g/dL   HCT 42.0 36.0 - 46.0 %   MCV 93.1 80.0 - 100.0 fL   MCH 32.8 26.0 - 34.0 pg   MCHC 35.2 30.0 - 36.0 g/dL   RDW 12.3 11.5 - 15.5 %   Platelets 169 150 - 400 K/uL   nRBC 0.0 0.0 - 0.2 %   Neutrophils Relative % 72 %    Neutro Abs 6.4 1.7 - 7.7 K/uL   Lymphocytes Relative 17 %   Lymphs Abs 1.5 0.7 - 4.0 K/uL   Monocytes Relative 8 %   Monocytes Absolute 0.7 0.1 - 1.0 K/uL   Eosinophils Relative 2 %   Eosinophils Absolute 0.1 0.0 - 0.5 K/uL   Basophils Relative 1 %   Basophils Absolute 0.1 0.0 - 0.1 K/uL   Immature Granulocytes 0 %   Abs Immature Granulocytes 0.03 0.00 - 0.07 K/uL    Comment: Performed at [facility], [address] [place], [place] [address]  Hemoglobin A1c     Status: Abnormal   Collection Time: 10/06/22  8:06 AM  Result Value Ref Range   Hgb A1c MFr Bld 10.5 (H) 4.8 - 5.6 %    Comment: (NOTE) Pre diabetes:          5.7%-6.4%  Diabetes:              >6.4%  Glycemic control for   <7.0% adults with diabetes    Mean Plasma Glucose 254.65 mg/dL    Comment: Performed at [facility], [place] [address], [place], [place] [address]  CBG monitoring, ED     Status: Abnormal   Collection Time: 10/06/22  8:08 AM  Result Value Ref Range   Glucose-Capillary 385 (H) 70 - 99 mg/dL    Comment: Glucose reference range applies only to samples taken after fasting for at least 8 hours.  CBG monitoring, ED     Status: Abnormal   Collection Time: 10/06/22  9:42 AM  Result Value Ref Range   Glucose-Capillary 285 (H) 70 - 99 mg/dL    Comment: Glucose reference range applies only to samples taken after fasting for at least 8 hours.  CBG monitoring, ED     Status: Abnormal   Collection Time: 10/06/22 12:01 PM  Result Value Ref Range   Glucose-Capillary 220 (H) 70 - 99 mg/dL    Comment: Glucose reference range applies only to samples taken after fasting for at least 8 hours.   Comment 1 Notify RN     No results found.  Review of Systems  Constitutional:  Positive for fatigue.  HENT: Negative.    Eyes: Negative.   Respiratory: Negative.    Cardiovascular: Negative.   Gastrointestinal: Negative.   Endocrine: Negative.   Genitourinary: Negative.   Musculoskeletal:         See HPI  Skin:        See HPI  Allergic/Immunologic: Negative.   Neurological: Negative.   Hematological: Negative.   Psychiatric/Behavioral: Negative.     Blood pressure 135/81, pulse 96,  temperature 98.1 F (36.7 C), temperature source Oral, resp. rate 19, height 5' 5.5" (1.664 m), weight 104.3 kg, SpO2 98 %. Physical Exam HENT:     Nose: Nose normal.     Mouth/Throat:     Mouth: Mucous membranes are moist.  Cardiovascular:     Rate and Rhythm: Normal rate and regular rhythm.     Pulses: Normal pulses.     Heart sounds: Normal heart sounds.  Pulmonary:     Effort: Pulmonary effort is normal.     Breath sounds: Normal breath sounds.  Abdominal:     General: Abdomen is flat.     Palpations: Abdomen is soft.     Comments: Small erythematous skin lesions lateral abdomen bilaterally without significant cellulitis  Skin:    Comments: Left axilla with large erythematous area of induration with greenish discharge and cellulitis  Neurological:     Mental Status: She is alert and oriented to person, place, and time.  Psychiatric:        Mood and Affect: Mood normal.     Assessment/Plan: Left axillary abscess associated with hidradenitis suppurativa -agree with vancomycin and cefepime.  I feel this would benefit from incision and drainage in the operating room.  I discussed the procedure, risks, and benefits.  She is agreeable.  I will discuss further with Dr. Dwain Sarna in the AM.  DM - per TRH Liz Malady 10/06/2022, 7:08 PM

## 2022-10-06 NOTE — Progress Notes (Signed)
Pharmacy Antibiotic Note  Breanna Norton is a 46 y.o. female admitted on 10/05/2022 with cellulitis and skin abscesses.  Pharmacy has been consulted for Vancomycin dosing.  CrCl 100-110 mL/min and is stable.  Plan: Vancomycin 2000 mg IV now, then Vancomycin 1000 IV Q12H (eAUC 449, Scr 0.66, Vd 0.5) Cefepime 2g Q8H Metronidazole 500 Q12H  Height: 5' 5.5" (166.4 cm) Weight: 104.3 kg (230 lb) IBW/kg (Calculated) : 58.15  Temp (24hrs), Avg:98.7 F (37.1 C), Min:98.2 F (36.8 C), Max:100.4 F (38 C)  Recent Labs  Lab 10/05/22 1830 10/05/22 1847 10/06/22 0438 10/06/22 0806  WBC 13.2*  --   --  8.8  CREATININE  --  0.66  --   --   LATICACIDVEN 3.1*  --  1.7  --      Estimated Creatinine Clearance: 108.5 mL/min (by C-G formula based on SCr of 0.66 mg/dL).    Allergies  Allergen Reactions   Keflex [Cephalexin] Hives    Tolerated cefepime 10/2022 admission   Metformin Diarrhea   Morphine And Related Other (See Comments)    Blacked out   Penicillins Hives    Tolerated cefepime 10/2022 admission    Merrilee Jansky, PharmD Clinical Pharmacist 10/06/2022 8:59 AM

## 2022-10-06 NOTE — H&P (Signed)
History and Physical    Breanna Norton IDP:[id number] DOB: 09-28-1976 DOA: 10/05/2022  PCP: [facility] (Confirm with patient/family/NH records and if not entered, this has to be entered at [facility] point of entry) Patient coming from: Home  I have personally briefly reviewed patient's old medical records in [facility]  Chief Complaint: Fever, multiple abscess  HPI: Breanna Norton is a 46 y.o. female with medical history significant of IDDM noncompliant with diabetic medications, anxiety/depression/PTSD, HLD, GERD, obesity, presented with fever and multiple abscess on the torso and scalp.  Patient off her insulin and diabetic medications about 3 weeks ago, and gradually she developed several abscesses of her torso and scalp and left shin area.  Most severe one is on left breast-armpit, for which she has been taking OTC pain medications and warm compression, last night, abscess persist, out thick greenish pus, with severe pain, along with low-grade fever.  She also complaining about several other abscesses on her abdominal wall bellybutton area and 1 on the left shin popped last week and no scarring as well as 1 abscess on scalp which also drained last week and now is healing.  She used to be on Trulicity but switched to Ozempic 2 months ago but 3 weeks ago, she ran out of all her diabetic medications. ED Course: Patient was found to be septic with fever 100.4, elevated lactic acid 3.1, and patient was given IV bolus and started on vancomycin cefepime and Flagyl at [facility] ED.  Review of Systems: As per HPI otherwise 14 point review of systems negative.    Past Medical History:  Diagnosis Date   Diabetes mellitus without complication (HCC)    GERD (gastroesophageal reflux disease)    High cholesterol    PTSD (post-traumatic stress disorder)     Past Surgical History:  Procedure Laterality Date   ABDOMINAL HYSTERECTOMY     CHOLECYSTECTOMY     oopharectomy      TONSILLECTOMY       reports that she has been smoking. She has been smoking an average of 1 pack per day. She has never used smokeless tobacco. She reports that she does not drink alcohol and does not use drugs.  Allergies  Allergen Reactions   Keflex [Cephalexin] Hives    Tolerated cefepime 10/2022 admission   Metformin Diarrhea   Morphine And Related Other (See Comments)    Blacked out   Penicillins Hives    Tolerated cefepime 10/2022 admission    History reviewed. No pertinent family history.   Prior to Admission medications   Medication Sig Start Date End Date Taking? Authorizing Provider  diclofenac Sodium (VOLTAREN) 1 % GEL Apply 2 g topically every 6 (six) hours as needed (pain).   Yes [provider]  escitalopram (LEXAPRO) 20 MG tablet Take 20 mg by mouth daily.   Yes [provider]  ibuprofen (ADVIL) 200 MG tablet Take 800 mg by mouth every 6 (six) hours as needed for moderate pain.   Yes [provider]  naproxen sodium (ALEVE) 220 MG tablet Take 440 mg by mouth 2 (two) times daily as needed (pain).   Yes [provider]  ondansetron (ZOFRAN) 4 MG tablet Take 1 tablet (4 mg total) by mouth every 6 (six) hours. Patient not taking: Reported on 10/06/2022 09/20/22   Small, Brooke L, PA  oxyCODONE-acetaminophen (PERCOCET/ROXICET) 5-325 MG tablet Take 1-2 tablets by mouth every 8 (eight) hours as needed for severe pain. 03/29/19   Maxwell Caul, PA-C  pantoprazole (PROTONIX) 20 MG tablet Take 1 tablet (20 mg total) by mouth daily. 04/13/18   Loren Racer, MD    Physical Exam: Vitals:   10/06/22 1200 10/06/22 1255 10/06/22 1400 10/06/22 1643  BP: 105/64 111/78 (!) 101/56 123/82  Pulse: 86 91 77 91  Resp: 15 16 17 19   Temp:    98.1 F (36.7 C)  TempSrc:    Oral  SpO2: 99% 100% 98% 97%  Weight:      Height:        Constitutional: NAD, calm, comfortable Vitals:   10/06/22 1200 10/06/22 1255 10/06/22 1400 10/06/22 1643  BP:  105/64 111/78 (!) 101/56 123/82  Pulse: 86 91 77 91  Resp: 15 16 17 19   Temp:    98.1 F (36.7 C)  TempSrc:    Oral  SpO2: 99% 100% 98% 97%  Weight:      Height:       Eyes: PERRL, lids and conjunctivae normal ENMT: Mucous membranes are moist. Posterior pharynx clear of any exudate or lesions.Normal dentition.  Neck: normal, supple, no masses, no thyromegaly Respiratory: clear to auscultation bilaterally, no wheezing, no crackles. Normal respiratory effort. No accessory muscle use.  Cardiovascular: Regular rate and rhythm, no murmurs / rubs / gallops. No extremity edema. 2+ pedal pulses. No carotid bruits.  Abdomen: no tenderness, no masses palpated. No hepatosplenomegaly. Bowel sounds positive.  Musculoskeletal: no clubbing / cyanosis. No joint deformity upper and lower extremities. Good ROM, no contractures. Normal muscle tone.  Skin: Large area of rash swelling on left upper quadrant breast bordering with left armpit, area about 10 x 6 cm, with significant tenderness and fluctuance and small skin opening, manually tried to compress and patient did not tolerate with severe pain Neurologic: CN 2-12 grossly intact. Sensation intact, DTR normal. Strength 5/5 in all 4.  Psychiatric: Normal judgment and insight. Alert and oriented x 3. Normal mood.     Labs on Admission: I have personally reviewed following labs and imaging studies  CBC: Recent Labs  Lab 10/05/22 1830 10/06/22 0806  WBC 13.2* 8.8  NEUTROABS 8.6* 6.4  HGB 15.3* 14.8  HCT 43.1 42.0  MCV 92.7 93.1  PLT 246 169   Basic Metabolic Panel: Recent Labs  Lab 10/05/22 1847  NA 132*  K 3.3*  CL 101  CO2 20*  GLUCOSE 388*  BUN <5*  CREATININE 0.66  CALCIUM 8.4*   GFR: Estimated Creatinine Clearance: 108.5 mL/min (by C-G formula based on SCr of 0.66 mg/dL). Liver Function Tests: Recent Labs  Lab 10/05/22 1847  AST 29  ALT 31  ALKPHOS 115  BILITOT 0.7  PROT 6.7  ALBUMIN 3.1*   No results for input(s):  "LIPASE", "AMYLASE" in the last 168 hours. No results for input(s): "AMMONIA" in the last 168 hours. Coagulation Profile: Recent Labs  Lab 10/05/22 2003  INR 1.1   Cardiac Enzymes: No results for input(s): "CKTOTAL", "CKMB", "CKMBINDEX", "TROPONINI" in the last 168 hours. BNP (last 3 results) No results for input(s): "PROBNP" in the last 8760 hours. HbA1C: No results for input(s): "HGBA1C" in the last 72 hours. CBG: Recent Labs  Lab 10/05/22 2255 10/06/22 0459 10/06/22 0808 10/06/22 0942 10/06/22 1201  GLUCAP 275* 388* 385* 285* 220*   Lipid Profile: No results for input(s): "CHOL", "HDL", "LDLCALC", "TRIG", "CHOLHDL", "LDLDIRECT" in the last 72 hours. Thyroid Function Tests: No results for input(s): "TSH", "T4TOTAL", "FREET4", "T3FREE", "THYROIDAB" in the last 72 hours. Anemia Panel: No results for input(s): "VITAMINB12", "FOLATE", "  FERRITIN", "TIBC", "IRON", "RETICCTPCT" in the last 72 hours. Urine analysis:    Component Value Date/Time   COLORURINE YELLOW 10/05/2022 1830   APPEARANCEUR HAZY (A) 10/05/2022 1830   LABSPEC 1.010 10/05/2022 1830   PHURINE 7.0 10/05/2022 1830   GLUCOSEU >=500 (A) 10/05/2022 1830   HGBUR TRACE (A) 10/05/2022 1830   BILIRUBINUR NEGATIVE 10/05/2022 1830   KETONESUR NEGATIVE 10/05/2022 1830   PROTEINUR NEGATIVE 10/05/2022 1830   UROBILINOGEN 0.2 12/15/2009 2154   NITRITE NEGATIVE 10/05/2022 1830   LEUKOCYTESUR NEGATIVE 10/05/2022 1830    Radiological Exams on Admission: No results found.  EKG: Independently reviewed.  Sinus, no acute ST changes.  Assessment/Plan Principal Problem:   Sepsis ([place])  (please populate well all problems here in Problem List. (For example, if patient is on BP meds at home and you resume or decide to hold them, it is a problem that needs to be her. Same for CAD, COPD, HLD and so on)  Sepsis, resolved -Due to multiple skin abscesses, no significant history of left breast-armpit, appears to have residual  pus, discussed with on-call general surgery, who will evaluate patient for possible further draining. -Continue current antibiotic coverage while waiting for culture result, -Check MRSA screen, -Continue vancomycin and cefepime -Toradol for pain  IDDM with hyperglycemia -Add Lantus 15 units daily -Continue resistant sliding scale -Consult case manager for medication assistance  Obesity -Recommend calorie control  Hypokalemia -PO replacement, recheck level tomorrow  PTSD -Continue Lexapro  DVT prophylaxis: Lovenox Code Status: Full code Family Communication: None at bedside Disposition Plan: Patient sick with sepsis multiple abscesses requiring IV antibiotics, expect more than 2 midnight hospital stay Consults called: General surgery Admission status: MedSurg admission   Lequita Halt MD [facility] Pager [phone]  10/06/2022, 5:21 PM

## 2022-10-06 NOTE — Progress Notes (Addendum)
NEW ADMISSION NOTE New Admission Note:   Arrival Method: stretcher Mental Orientation:  Telemetry:5M19 Assessment: Completed Skin intact redness all over, abrasion: left foot, L head, R/L flank under breast, belly button, abscess L armpit, and perineum    IV: 2 RAC, Left hand Pain:0/10 Tubes: none Safety Measures: Safety Fall Prevention Plan has been given, discussed and signed Admission: Completed [facility] Orientation: Patient has been orientated to the room, unit and staff.  Family: none at bedside  Orders have been reviewed and implemented. Will continue to monitor the patient. Call light has been placed within reach and bed alarm has been activated.   Robben Jagiello S Nadiya Pieratt, RN

## 2022-10-06 NOTE — ED Notes (Signed)
Chicken Noodle Soup and Water given.

## 2022-10-06 NOTE — ED Notes (Signed)
Report given to carelink Report accepted by floor nurse

## 2022-10-06 NOTE — ED Notes (Addendum)
Report accepted by floor nurse via secure chat

## 2022-10-06 NOTE — Consult Note (Signed)
Reason for Consult:left axillary abscess Referring Physician: [name] is an 46 y.o. female.  HPI: 46yo F with PMHx DM and hidradenitis suppurativa presented to [facility] with a left axillary abscess.  She was admitted by the hospitalist service and has been placed on IV vancomycin and cefepime.  I was asked to see her in consultation regarding this left axillary abscess.  She has a history of multiple subcutaneous abscesses associated with her hidradenitis.  These have required incision and drainage in the past.  She reports she has been feeling poorly for over a week in this area and her left axilla has gotten more swollen and red.  It has been draining greenish fluid.  She reports she recently also had some scalp abscesses but these have resolved.  Past Medical History:  Diagnosis Date   Diabetes mellitus without complication (HCC)    GERD (gastroesophageal reflux disease)    High cholesterol    PTSD (post-traumatic stress disorder)     Past Surgical History:  Procedure Laterality Date   ABDOMINAL HYSTERECTOMY     CHOLECYSTECTOMY     oopharectomy     TONSILLECTOMY      History reviewed. No pertinent family history.  Social History:  reports that she has been smoking. She has been smoking an average of 1 pack per day. She has never used smokeless tobacco. She reports that she does not drink alcohol and does not use drugs.  Allergies:  Allergies  Allergen Reactions   Keflex [Cephalexin] Hives    Tolerated cefepime 10/2022 admission   Metformin Diarrhea   Morphine And Related Other (See Comments)    Blacked out   Penicillins Hives    Tolerated cefepime 10/2022 admission    Medications: I have reviewed the patient's current medications.  Results for orders placed or performed during the hospital encounter of 10/05/22 (from the past 48 hour(s))  Lactic acid, plasma     Status: Abnormal   Collection Time: 10/05/22  6:30 PM  Result Value Ref Range    Lactic Acid, Venous 3.1 (HH) 0.5 - 1.9 mmol/L    Comment: CRITICAL RESULT CALLED TO, READ BACK BY AND VERIFIED WITH POWELL,V RN @1930  10/04/22 EDENSCA Performed at [facility], [address] [place], [place]   CBC with Differential     Status: Abnormal   Collection Time: 10/05/22  6:30 PM  Result Value Ref Range   WBC 13.2 (H) 4.0 - 10.5 K/uL   RBC 4.65 3.87 - 5.11 MIL/uL   Hemoglobin 15.3 (H) 12.0 - 15.0 g/dL   HCT 13/05/23 29.5 - 28.4 %   MCV 92.7 80.0 - 100.0 fL   MCH 32.9 26.0 - 34.0 pg   MCHC 35.5 30.0 - 36.0 g/dL   RDW 13.2 44.0 - 10.2 %   Platelets 246 150 - 400 K/uL   nRBC 0.0 0.0 - 0.2 %   Neutrophils Relative % 65 %   Neutro Abs 8.6 (H) 1.7 - 7.7 K/uL   Lymphocytes Relative 25 %   Lymphs Abs 3.3 0.7 - 4.0 K/uL   Monocytes Relative 8 %   Monocytes Absolute 1.0 0.1 - 1.0 K/uL   Eosinophils Relative 1 %   Eosinophils Absolute 0.2 0.0 - 0.5 K/uL   Basophils Relative 1 %   Basophils Absolute 0.1 0.0 - 0.1 K/uL   Immature Granulocytes 0 %   Abs Immature Granulocytes 0.05 0.00 - 0.07 K/uL    Comment: Performed  at [facility], [address] [place], [place] [address]  Culture, blood (Routine x 2)     Status: None (Preliminary result)   Collection Time: 10/05/22  6:30 PM   Specimen: BLOOD RIGHT HAND  Result Value Ref Range   Specimen Description      BLOOD RIGHT HAND Performed at [facility], [address] [place], [place] [address]    Special Requests      BOTTLES DRAWN AEROBIC AND ANAEROBIC Blood Culture adequate volume Performed at [facility], [address] [place], [place] [address]    Culture      NO GROWTH < 12 HOURS Performed at [facility], [address] [place], [place] [address]    Report Status PENDING   Urinalysis, Routine w reflex microscopic     Status: Abnormal   Collection Time: 10/05/22  6:30 PM  Result Value Ref Range   Color, Urine YELLOW YELLOW   APPearance HAZY (A) CLEAR    Specific Gravity, Urine 1.010 1.005 - 1.030   pH 7.0 5.0 - 8.0   Glucose, UA >=500 (A) NEGATIVE mg/dL   Hgb urine dipstick TRACE (A) NEGATIVE   Bilirubin Urine NEGATIVE NEGATIVE   Ketones, ur NEGATIVE NEGATIVE mg/dL   Protein, ur NEGATIVE NEGATIVE mg/dL   Nitrite NEGATIVE NEGATIVE   Leukocytes,Ua NEGATIVE NEGATIVE    Comment: Performed at [facility], [address] [place], [place] [address]  Urinalysis, Microscopic (reflex)     Status: Abnormal   Collection Time: 10/05/22  6:30 PM  Result Value Ref Range   RBC / HPF 0-5 0 - 5 RBC/hpf   WBC, UA 0-5 0 - 5 WBC/hpf   Bacteria, UA FEW (A) NONE SEEN   Squamous Epithelial / LPF 0-5 0 - 5   Budding Yeast PRESENT     Comment: Performed at [facility], [address] [place], [place] [address]  Culture, blood (Routine x 2)     Status: None (Preliminary result)   Collection Time: 10/05/22  6:32 PM   Specimen: BLOOD  Result Value Ref Range   Specimen Description      BLOOD RIGHT ANTECUBITAL Performed at [facility], [address] [place], [place] [address]    Special Requests      BOTTLES DRAWN AEROBIC AND ANAEROBIC Blood Culture adequate volume Performed at [facility], [address] [place], [place] [address]    Culture      NO GROWTH < 12 HOURS Performed at [facility], [address], [place], [place] [address]    Report Status PENDING   POC CBG, ED     Status: Abnormal   Collection Time: 10/05/22  6:44 PM  Result Value Ref Range   Glucose-Capillary 395 (H) 70 - 99 mg/dL    Comment: Glucose reference range applies only to samples taken after fasting for at least 8 hours.  SARS Coronavirus 2 by RT PCR (hospital order, performed in [facility] hospital lab) *cepheid single result test* Anterior Nasal Swab     Status: None   Collection Time: 10/05/22  6:47 PM   Specimen: Anterior Nasal Swab  Result Value Ref Range   SARS Coronavirus 2 by RT PCR NEGATIVE NEGATIVE     Comment: (NOTE) SARS-CoV-2 target nucleic acids are NOT DETECTED.  The SARS-CoV-2 RNA is generally detectable in upper and lower respiratory specimens during  the acute phase of infection. The lowest concentration of SARS-CoV-2 viral copies this assay can detect is 250 copies / mL. A negative result does not preclude SARS-CoV-2 infection and should not be used as the sole basis for treatment or other patient management decisions.  A negative result may occur with improper specimen collection / handling, submission of specimen other than nasopharyngeal swab, presence of viral mutation(s) within the areas targeted by this assay, and inadequate number of viral copies (<250 copies / mL). A negative result must be combined with clinical observations, patient history, and epidemiological information.  Fact Sheet for Patients:   RoadLapTop.co.zahttps://www.fda.gov/media/158405/download  Fact Sheet for Healthcare Providers: http://kim-miller.com/https://www.fda.gov/media/158404/download  This test is not yet approved or  cleared by the [place] FDA and has been authorized for detection and/or diagnosis of SARS-CoV-2 by FDA under an Emergency Use Authorization (EUA).  This EUA will remain in effect (meaning this test can be used) for the duration of the COVID-19 declaration under Section 564(b)(1) of the Act, 21 U.S.C. section 360bbb-3(b)(1), unless the authorization is terminated or revoked sooner.  Performed at [facility], [address] [place], [place] [address]   Comprehensive metabolic panel     Status: Abnormal   Collection Time: 10/05/22  6:47 PM  Result Value Ref Range   Sodium 132 (L) 135 - 145 mmol/L   Potassium 3.3 (L) 3.5 - 5.1 mmol/L   Chloride 101 98 - 111 mmol/L   CO2 20 (L) 22 - 32 mmol/L   Glucose, Bld 388 (H) 70 - 99 mg/dL    Comment: Glucose reference range applies only to samples taken after fasting for at least 8 hours.   BUN <5 (L) 6 - 20 mg/dL   Creatinine, Ser 1.910.66 0.44 - 1.00  mg/dL   Calcium 8.4 (L) 8.9 - 10.3 mg/dL   Total Protein 6.7 6.5 - 8.1 g/dL   Albumin 3.1 (L) 3.5 - 5.0 g/dL   AST 29 15 - 41 U/L   ALT 31 0 - 44 U/L   Alkaline Phosphatase 115 38 - 126 U/L   Total Bilirubin 0.7 0.3 - 1.2 mg/dL   GFR, Estimated >47>60 >82>60 mL/min    Comment: (NOTE) Calculated using the CKD-EPI Creatinine Equation (2021)    Anion gap 11 5 - 15    Comment: Performed at [facility], [address] [place], [place] [address]  Protime-INR     Status: None   Collection Time: 10/05/22  8:03 PM  Result Value Ref Range   Prothrombin Time 13.8 11.4 - 15.2 seconds   INR 1.1 0.8 - 1.2    Comment: (NOTE) INR goal varies based on device and disease states. Performed at [facility] [place], [address] [place], [place] [address]   CBG monitoring, ED     Status: Abnormal   Collection Time: 10/05/22 10:55 PM  Result Value Ref Range   Glucose-Capillary 275 (H) 70 - 99 mg/dL    Comment: Glucose reference range applies only to samples taken after fasting for at least 8 hours.  Lactic acid, plasma     Status: None   Collection Time: 10/06/22  4:38 AM  Result Value Ref Range   Lactic Acid, Venous 1.7 0.5 - 1.9 mmol/L    Comment: Performed at [facility], [address] [place], [place] [address]  CBG monitoring, ED     Status: Abnormal   Collection Time: 10/06/22  4:59 AM  Result Value Ref Range   Glucose-Capillary 388 (H) 70 - 99 mg/dL    Comment: Glucose reference range applies only to samples taken after fasting for at least 8 hours.  CBC with Differential     Status: None   Collection Time: 10/06/22  8:06 AM  Result Value Ref Range   WBC 8.8 4.0 - 10.5 K/uL   RBC 4.51 3.87 - 5.11 MIL/uL   Hemoglobin 14.8 12.0 - 15.0 g/dL   HCT 42.0 36.0 - 46.0 %   MCV 93.1 80.0 - 100.0 fL   MCH 32.8 26.0 - 34.0 pg   MCHC 35.2 30.0 - 36.0 g/dL   RDW 12.3 11.5 - 15.5 %   Platelets 169 150 - 400 K/uL   nRBC 0.0 0.0 - 0.2 %   Neutrophils Relative % 72 %    Neutro Abs 6.4 1.7 - 7.7 K/uL   Lymphocytes Relative 17 %   Lymphs Abs 1.5 0.7 - 4.0 K/uL   Monocytes Relative 8 %   Monocytes Absolute 0.7 0.1 - 1.0 K/uL   Eosinophils Relative 2 %   Eosinophils Absolute 0.1 0.0 - 0.5 K/uL   Basophils Relative 1 %   Basophils Absolute 0.1 0.0 - 0.1 K/uL   Immature Granulocytes 0 %   Abs Immature Granulocytes 0.03 0.00 - 0.07 K/uL    Comment: Performed at [facility], [address] [place], [place] [address]  Hemoglobin A1c     Status: Abnormal   Collection Time: 10/06/22  8:06 AM  Result Value Ref Range   Hgb A1c MFr Bld 10.5 (H) 4.8 - 5.6 %    Comment: (NOTE) Pre diabetes:          5.7%-6.4%  Diabetes:              >6.4%  Glycemic control for   <7.0% adults with diabetes    Mean Plasma Glucose 254.65 mg/dL    Comment: Performed at [facility], [place] [address], [place], [place] [address]  CBG monitoring, ED     Status: Abnormal   Collection Time: 10/06/22  8:08 AM  Result Value Ref Range   Glucose-Capillary 385 (H) 70 - 99 mg/dL    Comment: Glucose reference range applies only to samples taken after fasting for at least 8 hours.  CBG monitoring, ED     Status: Abnormal   Collection Time: 10/06/22  9:42 AM  Result Value Ref Range   Glucose-Capillary 285 (H) 70 - 99 mg/dL    Comment: Glucose reference range applies only to samples taken after fasting for at least 8 hours.  CBG monitoring, ED     Status: Abnormal   Collection Time: 10/06/22 12:01 PM  Result Value Ref Range   Glucose-Capillary 220 (H) 70 - 99 mg/dL    Comment: Glucose reference range applies only to samples taken after fasting for at least 8 hours.   Comment 1 Notify RN     No results found.  Review of Systems  Constitutional:  Positive for fatigue.  HENT: Negative.    Eyes: Negative.   Respiratory: Negative.    Cardiovascular: Negative.   Gastrointestinal: Negative.   Endocrine: Negative.   Genitourinary: Negative.   Musculoskeletal:         See HPI  Skin:        See HPI  Allergic/Immunologic: Negative.   Neurological: Negative.   Hematological: Negative.   Psychiatric/Behavioral: Negative.     Blood pressure 135/81, pulse 96,  temperature 98.1 F (36.7 C), temperature source Oral, resp. rate 19, height 5' 5.5" (1.664 m), weight 104.3 kg, SpO2 98 %. Physical Exam HENT:     Nose: Nose normal.     Mouth/Throat:     Mouth: Mucous membranes are moist.  Cardiovascular:     Rate and Rhythm: Normal rate and regular rhythm.     Pulses: Normal pulses.     Heart sounds: Normal heart sounds.  Pulmonary:     Effort: Pulmonary effort is normal.     Breath sounds: Normal breath sounds.  Abdominal:     General: Abdomen is flat.     Palpations: Abdomen is soft.     Comments: Small erythematous skin lesions lateral abdomen bilaterally without significant cellulitis  Skin:    Comments: Left axilla with large erythematous area of induration with greenish discharge and cellulitis  Neurological:     Mental Status: She is alert and oriented to person, place, and time.  Psychiatric:        Mood and Affect: Mood normal.     Assessment/Plan: Left axillary abscess associated with hidradenitis suppurativa -agree with vancomycin and cefepime.  I feel this would benefit from incision and drainage in the operating room.  I discussed the procedure, risks, and benefits.  She is agreeable.  I will discuss further with Dr. Dwain Sarna in the AM.  DM - per TRH Liz Malady 10/06/2022, 7:08 PM

## 2022-10-06 NOTE — ED Notes (Signed)
Pt oxygen 86-88% on RA while sleeping, pt placed on 2L [place] with improvement to 98%

## 2022-10-06 NOTE — ED Notes (Signed)
ED TO INPATIENT HANDOFF REPORT  ED Nurse Name and Phone #: Berneta Sages RN  S Name/Age/Gender Breanna Norton 46 y.o. female Room/Bed: MH02/MH02  Code Status   Code Status: Not on file  Home/SNF/Other Home Patient oriented to: self, place, time, and situation Is this baseline? Yes   Triage Complete: Triage complete  Chief Complaint Sepsis [facility]) [A41.9]  Triage Note Pt arrives pov, steady gait, c/o multiple abscess - LT foot, RT axilla, LT side of head, vagina, RT flank, and endorses bleeding from umbilicus. Febrile in triage   Allergies Allergies  Allergen Reactions   Keflex [Cephalexin] Hives    Tolerated cefepime 10/2022 admission   Metformin Diarrhea   Morphine And Related Other (See Comments)    Blacked out   Penicillins Hives    Tolerated cefepime 10/2022 admission    Level of Care/Admitting Diagnosis ED Disposition     ED Disposition  Admit   Condition  --   [place]: [place] [100100]  Level of Care: Telemetry Surgical [105]  May admit patient to [facility] or [facility] if equivalent level of care is available:: Yes  Interfacility transfer: Yes  Covid Evaluation: Asymptomatic - no recent exposure (last 10 days) testing not required  [facility]: [id number]  Admitting Physician: Kayleen Memos [id number]  Attending Physician: Kayleen Memos [id number]  Certification:: I certify this patient will need inpatient services for at least 2 midnights  Estimated Length of Stay: 2          B Medical/Surgery History Past Medical History:  Diagnosis Date   Diabetes mellitus without complication ([place])    GERD (gastroesophageal reflux disease)    High cholesterol    PTSD (post-traumatic stress disorder)    Past Surgical History:  Procedure Laterality Date   ABDOMINAL HYSTERECTOMY     CHOLECYSTECTOMY     oopharectomy     TONSILLECTOMY       A IV Location/Drains/Wounds Patient Lines/Drains/Airways  Status     Active Line/Drains/Airways     Name Placement date Placement time Site Days   Peripheral IV 10/05/22 20 G Anterior;Left Hand 10/05/22  1830  Hand  1   Peripheral IV 10/05/22 20 G Right Antecubital 10/05/22  1840  Antecubital  1   Peripheral IV 10/06/22 22 G Right Forearm 10/06/22  0430  Forearm  less than 1            Intake/Output Last 24 hours  Intake/Output Summary (Last 24 hours) at 10/06/2022 1247 Last data filed at 10/06/2022 1130 Gross per 24 hour  Intake 2676.4 ml  Output --  Net 2676.4 ml    Labs/Imaging Results for orders placed or performed during the hospital encounter of 10/05/22 (from the past 48 hour(s))  Lactic acid, plasma     Status: Abnormal   Collection Time: 10/05/22  6:30 PM  Result Value Ref Range   Lactic Acid, Venous 3.1 (HH) 0.5 - 1.9 mmol/L    Comment: CRITICAL RESULT CALLED TO, READ BACK BY AND VERIFIED WITH POWELL,V RN @1930  10/04/22 EDENSCA Performed at [facility], [address] [place], [place] [address]   CBC with Differential     Status: Abnormal   Collection Time: 10/05/22  6:30 PM  Result Value Ref Range   WBC 13.2 (H) 4.0 - 10.5 K/uL   RBC 4.65 3.87 - 5.11 MIL/uL   Hemoglobin 15.3 (H) 12.0 - 15.0 g/dL   HCT 43.1 36.0 - 46.0 %  MCV 92.7 80.0 - 100.0 fL   MCH 32.9 26.0 - 34.0 pg   MCHC 35.5 30.0 - 36.0 g/dL   RDW 12.1 11.5 - 15.5 %   Platelets 246 150 - 400 K/uL   nRBC 0.0 0.0 - 0.2 %   Neutrophils Relative % 65 %   Neutro Abs 8.6 (H) 1.7 - 7.7 K/uL   Lymphocytes Relative 25 %   Lymphs Abs 3.3 0.7 - 4.0 K/uL   Monocytes Relative 8 %   Monocytes Absolute 1.0 0.1 - 1.0 K/uL   Eosinophils Relative 1 %   Eosinophils Absolute 0.2 0.0 - 0.5 K/uL   Basophils Relative 1 %   Basophils Absolute 0.1 0.0 - 0.1 K/uL   Immature Granulocytes 0 %   Abs Immature Granulocytes 0.05 0.00 - 0.07 K/uL    Comment: Performed at [facility], [address] [place], [place] [address]  Culture, blood (Routine x 2)      Status: None (Preliminary result)   Collection Time: 10/05/22  6:30 PM   Specimen: BLOOD RIGHT HAND  Result Value Ref Range   Specimen Description      BLOOD RIGHT HAND Performed at [facility], [address] [place], [place] [address]    Special Requests      BOTTLES DRAWN AEROBIC AND ANAEROBIC Blood Culture adequate volume Performed at [facility], [address] [place], [place] [address]    Culture      NO GROWTH < 12 HOURS Performed at [facility], [place] [place], [place] [address]    Report Status PENDING   Urinalysis, Routine w reflex microscopic     Status: Abnormal   Collection Time: 10/05/22  6:30 PM  Result Value Ref Range   Color, Urine YELLOW YELLOW   APPearance HAZY (A) CLEAR   Specific Gravity, Urine 1.010 1.005 - 1.030   pH 7.0 5.0 - 8.0   Glucose, UA >=500 (A) NEGATIVE mg/dL   Hgb urine dipstick TRACE (A) NEGATIVE   Bilirubin Urine NEGATIVE NEGATIVE   Ketones, ur NEGATIVE NEGATIVE mg/dL   Protein, ur NEGATIVE NEGATIVE mg/dL   Nitrite NEGATIVE NEGATIVE   Leukocytes,Ua NEGATIVE NEGATIVE    Comment: Performed at [facility], [address] [place], [place] [address]  Urinalysis, Microscopic (reflex)     Status: Abnormal   Collection Time: 10/05/22  6:30 PM  Result Value Ref Range   RBC / HPF 0-5 0 - 5 RBC/hpf   WBC, UA 0-5 0 - 5 WBC/hpf   Bacteria, UA FEW (A) NONE SEEN   Squamous Epithelial / LPF 0-5 0 - 5   Budding Yeast PRESENT     Comment: Performed at [facility], [address] [place], [place] [address]  Culture, blood (Routine x 2)     Status: None (Preliminary result)   Collection Time: 10/05/22  6:32 PM   Specimen: BLOOD  Result Value Ref Range   Specimen Description      BLOOD RIGHT ANTECUBITAL Performed at [facility], [address] [place], [place] [address]    Special Requests      BOTTLES DRAWN AEROBIC AND ANAEROBIC Blood Culture adequate  volume Performed at [facility], [address] [place], [place] [address]    Culture      NO GROWTH < 12 HOURS Performed at [facility], [place] [address],  [place], [place] [address]    Report Status PENDING   POC CBG, ED     Status: Abnormal   Collection Time: 10/05/22  6:44 PM  Result Value Ref Range   Glucose-Capillary 395 (H) 70 - 99 mg/dL    Comment: Glucose reference range applies only to samples taken after fasting for at least 8 hours.  SARS Coronavirus 2 by RT PCR (hospital order, performed in [facility] hospital lab) *cepheid single result test* Anterior Nasal Swab     Status: None   Collection Time: 10/05/22  6:47 PM   Specimen: Anterior Nasal Swab  Result Value Ref Range   SARS Coronavirus 2 by RT PCR NEGATIVE NEGATIVE    Comment: (NOTE) SARS-CoV-2 target nucleic acids are NOT DETECTED.  The SARS-CoV-2 RNA is generally detectable in upper and lower respiratory specimens during the acute phase of infection. The lowest concentration of SARS-CoV-2 viral copies this assay can detect is 250 copies / mL. A negative result does not preclude SARS-CoV-2 infection and should not be used as the sole basis for treatment or other patient management decisions.  A negative result may occur with improper specimen collection / handling, submission of specimen other than nasopharyngeal swab, presence of viral mutation(s) within the areas targeted by this assay, and inadequate number of viral copies (<250 copies / mL). A negative result must be combined with clinical observations, patient history, and epidemiological information.  Fact Sheet for Patients:   https://www.patel.info/  Fact Sheet for Healthcare Providers: https://hall.com/  This test is not yet approved or  cleared by the [place] FDA and has been authorized for detection and/or diagnosis of SARS-CoV-2 by FDA under an Emergency Use Authorization (EUA).   This EUA will remain in effect (meaning this test can be used) for the duration of the COVID-19 declaration under Section 564(b)(1) of the Act, 21 U.S.C. section 360bbb-3(b)(1), unless the authorization is terminated or revoked sooner.  Performed at [facility], [address] [place], [place] [address]   Comprehensive metabolic panel     Status: Abnormal   Collection Time: 10/05/22  6:47 PM  Result Value Ref Range   Sodium 132 (L) 135 - 145 mmol/L   Potassium 3.3 (L) 3.5 - 5.1 mmol/L   Chloride 101 98 - 111 mmol/L   CO2 20 (L) 22 - 32 mmol/L   Glucose, Bld 388 (H) 70 - 99 mg/dL    Comment: Glucose reference range applies only to samples taken after fasting for at least 8 hours.   BUN <5 (L) 6 - 20 mg/dL   Creatinine, Ser 0.66 0.44 - 1.00 mg/dL   Calcium 8.4 (L) 8.9 - 10.3 mg/dL   Total Protein 6.7 6.5 - 8.1 g/dL   Albumin 3.1 (L) 3.5 - 5.0 g/dL   AST 29 15 - 41 U/L   ALT 31 0 - 44 U/L   Alkaline Phosphatase 115 38 - 126 U/L   Total Bilirubin 0.7 0.3 - 1.2 mg/dL   GFR, Estimated >60 >60 mL/min    Comment: (NOTE) Calculated using the CKD-EPI Creatinine Equation (2021)    Anion gap 11 5 - 15    Comment: Performed at [facility], [address] [place], [place] [address]  Protime-INR     Status: None   Collection Time: 10/05/22  8:03 PM  Result Value Ref Range   Prothrombin Time 13.8 11.4 - 15.2 seconds   INR 1.1 0.8 - 1.2    Comment: (NOTE)  INR goal varies based on device and disease states. Performed at [facility], [address] [place], [place] [address]   CBG monitoring, ED     Status: Abnormal   Collection Time: 10/05/22 10:55 PM  Result Value Ref Range   Glucose-Capillary 275 (H) 70 - 99 mg/dL    Comment: Glucose reference range applies only to samples taken after fasting for at least 8 hours.  Lactic acid, plasma     Status: None   Collection Time: 10/06/22  4:38 AM  Result Value Ref Range   Lactic Acid, Venous 1.7 0.5 -  1.9 mmol/L    Comment: Performed at [facility], [address] [place], [place] [address]  CBG monitoring, ED     Status: Abnormal   Collection Time: 10/06/22  4:59 AM  Result Value Ref Range   Glucose-Capillary 388 (H) 70 - 99 mg/dL    Comment: Glucose reference range applies only to samples taken after fasting for at least 8 hours.  CBC with Differential     Status: None   Collection Time: 10/06/22  8:06 AM  Result Value Ref Range   WBC 8.8 4.0 - 10.5 K/uL   RBC 4.51 3.87 - 5.11 MIL/uL   Hemoglobin 14.8 12.0 - 15.0 g/dL   HCT 42.0 36.0 - 46.0 %   MCV 93.1 80.0 - 100.0 fL   MCH 32.8 26.0 - 34.0 pg   MCHC 35.2 30.0 - 36.0 g/dL   RDW 12.3 11.5 - 15.5 %   Platelets 169 150 - 400 K/uL   nRBC 0.0 0.0 - 0.2 %   Neutrophils Relative % 72 %   Neutro Abs 6.4 1.7 - 7.7 K/uL   Lymphocytes Relative 17 %   Lymphs Abs 1.5 0.7 - 4.0 K/uL   Monocytes Relative 8 %   Monocytes Absolute 0.7 0.1 - 1.0 K/uL   Eosinophils Relative 2 %   Eosinophils Absolute 0.1 0.0 - 0.5 K/uL   Basophils Relative 1 %   Basophils Absolute 0.1 0.0 - 0.1 K/uL   Immature Granulocytes 0 %   Abs Immature Granulocytes 0.03 0.00 - 0.07 K/uL    Comment: Performed at [facility], [address] [place], [place] [address]  CBG monitoring, ED     Status: Abnormal   Collection Time: 10/06/22  8:08 AM  Result Value Ref Range   Glucose-Capillary 385 (H) 70 - 99 mg/dL    Comment: Glucose reference range applies only to samples taken after fasting for at least 8 hours.  CBG monitoring, ED     Status: Abnormal   Collection Time: 10/06/22  9:42 AM  Result Value Ref Range   Glucose-Capillary 285 (H) 70 - 99 mg/dL    Comment: Glucose reference range applies only to samples taken after fasting for at least 8 hours.  CBG monitoring, ED     Status: Abnormal   Collection Time: 10/06/22 12:01 PM  Result Value Ref Range   Glucose-Capillary 220 (H) 70 - 99 mg/dL    Comment: Glucose reference range applies  only to samples taken after fasting for at least 8 hours.   Comment 1 Notify RN    No results found.  Pending Labs Unresulted Labs (From admission, onward)     Start     Ordered   10/06/22 [id number]  Basic metabolic panel  Once,   STAT        10/06/22 0845   10/06/22 [id number]  Hemoglobin A1c  Once,   URGENT       Comments: To assess prior glycemic control    10/06/22 0724            Vitals/Pain Today's Vitals   10/06/22 1130 10/06/22 1130 10/06/22 1145 10/06/22 1200  BP: 106/61   105/64  Pulse: 85  90 86  Resp: 17  18 15   Temp:  98.1 F (36.7 C)    TempSrc:      SpO2: 98%  100% 99%  Weight:      Height:      PainSc:        Isolation Precautions Airborne and Contact precautions  Medications Medications  fentaNYL (SUBLIMAZE) injection 50 mcg (50 mcg Intravenous Given 10/05/22 2004)  insulin aspart (novoLOG) injection 0-15 Units (15 Units Subcutaneous Given 10/06/22 0816)  insulin aspart (novoLOG) injection 0-5 Units (has no administration in time range)  vancomycin (VANCOCIN) IVPB 1000 mg/200 mL premix (has no administration in time range)  ceFEPIme (MAXIPIME) 2 g in sodium chloride 0.9 % 100 mL IVPB (0 g Intravenous Stopped 10/06/22 1130)  metroNIDAZOLE (FLAGYL) IVPB 500 mg (0 mg Intravenous Stopped 10/06/22 1025)  nicotine (NICODERM CQ - dosed in mg/24 hours) patch 14 mg (14 mg Transdermal Patch Applied 10/06/22 1054)  lactated ringers bolus 2,000 mL (0 mLs Intravenous Stopped 10/05/22 2015)  acetaminophen (TYLENOL) tablet 1,000 mg (1,000 mg Oral Given 10/05/22 1842)  ceFEPIme (MAXIPIME) 2 g in sodium chloride 0.9 % 100 mL IVPB (0 g Intravenous Stopped 10/05/22 2015)  diphenhydrAMINE (BENADRYL) injection 50 mg (50 mg Intravenous Given 10/05/22 1858)  insulin aspart protamine- aspart (NOVOLOG MIX 70/30) injection 6 Units (6 Units Subcutaneous Given 10/05/22 2004)  lactated ringers bolus 3,129 mL (0 mLs Intravenous Stopped 10/06/22 0712)  vancomycin (VANCOCIN) IVPB 1000 mg/200 mL  premix (0 mg Intravenous Stopped 10/06/22 0837)    Mobility walks Low fall risk    Focused Assessments Open abscess to left axilla. Dressing changed this morning. Green drainage oozing out of area. Dry dressing to area   R Recommendations: See Admitting Provider Note  Report given to:   Additional Notes:    Oxygen room air at this time.  3 IVs were placed last night  I have been using the right FA for her antibiotics today.

## 2022-10-06 NOTE — ED Provider Notes (Signed)
Patient with h/o MRSA bacteremia as well as DM and hyperglycemia with multiple abscesses.  Blood cultures sent  Vancomycin given, will add zosyn to cover for pseudomonas in the setting of DM.    IVF at a rate initiated.     Saida Lonon, MD 10/06/22 [phone]

## 2022-10-06 NOTE — Inpatient Diabetes Management (Signed)
Inpatient Diabetes Program Recommendations  AACE/ADA: New Consensus Statement on Inpatient Glycemic Control   Target Ranges:  Prepandial:   less than 140 mg/dL      Peak postprandial:   less than 180 mg/dL (1-2 hours)      Critically ill patients:  140 - 180 mg/dL    Latest Reference Range & Units 10/06/22 04:59 10/06/22 08:08 10/06/22 09:42  Glucose-Capillary 70 - 99 mg/dL 388 (H) 385 (H) 285 (H)   Review of Glycemic Control  Diabetes history: DM2 Outpatient Diabetes medications: Metformin 1000 mg BID, Glipizide (no dose or frequency), Januvia (no dose or frequency) Current orders for Inpatient glycemic control: Novolog 0-15 units TID with meals, Novolog 0-5 units QHS  Inpatient Diabetes Program Recommendations:    Insulin: Please consider ordering Semglee 15 units daily (based on 104.3 k x 0.15 units).  Thanks, Barnie Alderman, RN, MSN, [place] Diabetes Coordinator Inpatient Diabetes Program [phone] (Team Pager from 8am to [place])

## 2022-10-06 NOTE — ED Provider Notes (Signed)
Patient signed out to me at 07 100 by Dr. Nicholes Stairs pending an inpatient bed.  In short this is a 46 year old female with a past medical history of diabetes presenting to the emergency department with multiple abscesses.  She was started on Vanco and Zosyn.  Blood cultures were sent.  Her blood sugars have remained elevated in the 300s, she is otherwise no signs of DKA.  On her exam, she is awake and alert resting in bed comfortably in no acute distress.  She has no complaints at this time.  We will reach out to [facility] regarding inpatient bed status and she may require ED to ED transfer for hospitalist evaluation if we are unable to obtain an inpatient bed in a timely manner at this morning.   Kemper Durie, DO 10/06/22 [phone]

## 2022-10-06 NOTE — ED Notes (Signed)
Drsg to left axilla with large amount green drainage. Removed old drsg and area cleaned with NS. Open area noted to have a continuous green drainage coming from abscess area

## 2022-10-06 NOTE — Progress Notes (Signed)
Patient's SPO2 dropped to 87%.  Place patient on 2 liter nasal cannula.  Patients SPO2 is 97%.  RT will continue to monitor.

## 2022-10-07 ENCOUNTER — Inpatient Hospital Stay (HOSPITAL_COMMUNITY): Payer: BLUE CROSS/BLUE SHIELD | Admitting: Certified Registered Nurse Anesthetist

## 2022-10-07 ENCOUNTER — Other Ambulatory Visit: Payer: Self-pay

## 2022-10-07 ENCOUNTER — Encounter (HOSPITAL_COMMUNITY)
Admission: EM | Disposition: A | Discharge status: Home / Self Care | Payer: Self-pay | Source: Home / Self Care | Attending: Internal Medicine

## 2022-10-07 ENCOUNTER — Encounter (HOSPITAL_COMMUNITY): Payer: Self-pay | Admitting: Internal Medicine

## 2022-10-07 DIAGNOSIS — E669 Obesity, unspecified: Secondary | ICD-10-CM | POA: Insufficient documentation

## 2022-10-07 DIAGNOSIS — E1165 Type 2 diabetes mellitus with hyperglycemia: Secondary | ICD-10-CM | POA: Insufficient documentation

## 2022-10-07 DIAGNOSIS — E876 Hypokalemia: Secondary | ICD-10-CM | POA: Insufficient documentation

## 2022-10-07 DIAGNOSIS — L02419 Cutaneous abscess of limb, unspecified: Secondary | ICD-10-CM | POA: Insufficient documentation

## 2022-10-07 DIAGNOSIS — E871 Hypo-osmolality and hyponatremia: Secondary | ICD-10-CM | POA: Insufficient documentation

## 2022-10-07 DIAGNOSIS — D72829 Elevated white blood cell count, unspecified: Secondary | ICD-10-CM | POA: Insufficient documentation

## 2022-10-07 DIAGNOSIS — A419 Sepsis, unspecified organism: Secondary | ICD-10-CM | POA: Diagnosis not present

## 2022-10-07 HISTORY — PX: INCISION AND DRAINAGE ABSCESS: SHX5864

## 2022-10-07 LAB — GLUCOSE, CAPILLARY
Glucose-Capillary: 323 mg/dL — ABNORMAL HIGH (ref 70–99)
Glucose-Capillary: 313 mg/dL — ABNORMAL HIGH (ref 70–99)
Glucose-Capillary: 203 mg/dL — ABNORMAL HIGH (ref 70–99)
Glucose-Capillary: 166 mg/dL — ABNORMAL HIGH (ref 70–99)
Glucose-Capillary: 170 mg/dL — ABNORMAL HIGH (ref 70–99)
Glucose-Capillary: 195 mg/dL — ABNORMAL HIGH (ref 70–99)
Glucose-Capillary: 357 mg/dL — ABNORMAL HIGH (ref 70–99)

## 2022-10-07 LAB — BASIC METABOLIC PANEL
Anion gap: 7 (ref 5–15)
BUN: 6 mg/dL (ref 6–20)
CO2: 23 mmol/L (ref 22–32)
Calcium: 8.7 mg/dL — ABNORMAL LOW (ref 8.9–10.3)
Chloride: 104 mmol/L (ref 98–111)
Creatinine, Ser: 0.56 mg/dL (ref 0.44–1.00)
GFR, Estimated: >60 mL/min (ref >60)
Glucose, Bld: 325 mg/dL — ABNORMAL HIGH (ref 70–99)
Potassium: 3.6 mmol/L (ref 3.5–5.1)
Sodium: 134 mmol/L — ABNORMAL LOW (ref 135–145)

## 2022-10-07 LAB — HIV ANTIBODY (ROUTINE TESTING W REFLEX): HIV Screen 4th Generation wRfx: NONREACTIVE

## 2022-10-07 LAB — CBC
HCT: 41.4 % (ref 36.0–46.0)
Hemoglobin: 14.3 g/dL (ref 12.0–15.0)
MCH: 32.7 pg (ref 26.0–34.0)
MCHC: 34.5 g/dL (ref 30.0–36.0)
MCV: 94.7 fL (ref 80.0–100.0)
Platelets: 215 10*3/uL (ref 150–400)
RBC: 4.37 MIL/uL (ref 3.87–5.11)
RDW: 12.1 % (ref 11.5–15.5)
WBC: 7.2 10*3/uL (ref 4.0–10.5)
nRBC: 0 % (ref 0.0–0.2)

## 2022-10-07 SURGERY — INCISION AND DRAINAGE, ABSCESS
Anesthesia: General | Laterality: Left

## 2022-10-07 MED ORDER — ONDANSETRON HCL 4 MG/2ML IJ SOLN: 4.0000 mg | Freq: Once | INTRAMUSCULAR | Status: DC | PRN

## 2022-10-07 MED ORDER — INSULIN GLARGINE-YFGN 100 UNIT/ML ~~LOC~~ SOLN
20.0000 [IU] | Freq: Every day | SUBCUTANEOUS | Status: DC
  Administered 2022-10-07: 20 [IU] via SUBCUTANEOUS
  Filled 2022-10-07: qty 0.2

## 2022-10-07 MED ORDER — ACETAMINOPHEN 325 MG PO TABS: 325.0000 mg | ORAL_TABLET | ORAL | Status: DC | PRN

## 2022-10-07 MED ORDER — MEPERIDINE HCL 25 MG/ML IJ SOLN: 6.2500 mg | INTRAMUSCULAR | Status: DC | PRN

## 2022-10-07 MED ORDER — LIDOCAINE 2% (20 MG/ML) 5 ML SYRINGE
INTRAMUSCULAR | Status: DC | PRN
  Administered 2022-10-07: 70 mg via INTRAVENOUS

## 2022-10-07 MED ORDER — LACTATED RINGERS IV SOLN: INTRAVENOUS | Status: DC

## 2022-10-07 MED ORDER — CHLORHEXIDINE GLUCONATE 0.12 % MT SOLN
15.0000 mL | Freq: Once | OROMUCOSAL | Status: AC
  Administered 2022-10-07: 15 mL via OROMUCOSAL

## 2022-10-07 MED ORDER — OXYCODONE HCL 5 MG/5ML PO SOLN: 5.0000 mg | Freq: Once | ORAL | Status: DC | PRN

## 2022-10-07 MED ORDER — OXYCODONE HCL 5 MG PO TABS: 5.0000 mg | ORAL_TABLET | Freq: Once | ORAL | Status: DC | PRN

## 2022-10-07 MED ORDER — FENTANYL CITRATE (PF) 100 MCG/2ML IJ SOLN
25.0000 ug | INTRAMUSCULAR | Status: DC | PRN
  Administered 2022-10-07 (×2): 50 ug via INTRAVENOUS

## 2022-10-07 MED ORDER — INSULIN ASPART 100 UNIT/ML IJ SOLN: 0.0000 [IU] | INTRAMUSCULAR | Status: DC | PRN

## 2022-10-07 MED ORDER — MIDAZOLAM HCL 2 MG/2ML IJ SOLN
INTRAMUSCULAR | Status: AC
  Filled 2022-10-07: qty 2

## 2022-10-07 MED ORDER — FENTANYL CITRATE (PF) 100 MCG/2ML IJ SOLN: 25.0000 ug | INTRAMUSCULAR | Status: DC | PRN

## 2022-10-07 MED ORDER — EPHEDRINE SULFATE-NACL 50-0.9 MG/10ML-% IV SOSY
PREFILLED_SYRINGE | INTRAVENOUS | Status: DC | PRN
  Administered 2022-10-07: 5 mg via INTRAVENOUS

## 2022-10-07 MED ORDER — SODIUM CHLORIDE 0.9 % IV SOLN
2.0000 g | Freq: Three times a day (TID) | INTRAVENOUS | Status: DC
  Administered 2022-10-07 – 2022-10-08 (×4): 2 g via INTRAVENOUS
  Filled 2022-10-07 (×4): qty 12.5

## 2022-10-07 MED ORDER — INSULIN GLARGINE-YFGN 100 UNIT/ML ~~LOC~~ SOLN
30.0000 [IU] | Freq: Every day | SUBCUTANEOUS | Status: DC
  Administered 2022-10-08: 30 [IU] via SUBCUTANEOUS
  Filled 2022-10-07 (×3): qty 0.3

## 2022-10-07 MED ORDER — FENTANYL CITRATE (PF) 250 MCG/5ML IJ SOLN
INTRAMUSCULAR | Status: DC | PRN
  Administered 2022-10-07 (×3): 25 ug via INTRAVENOUS

## 2022-10-07 MED ORDER — ACETAMINOPHEN 160 MG/5ML PO SOLN: 325.0000 mg | ORAL | Status: DC | PRN

## 2022-10-07 MED ORDER — BUPIVACAINE-EPINEPHRINE (PF) 0.25% -1:200000 IJ SOLN
INTRAMUSCULAR | Status: AC
  Filled 2022-10-07: qty 30

## 2022-10-07 MED ORDER — PROPOFOL 10 MG/ML IV BOLUS
INTRAVENOUS | Status: AC
  Filled 2022-10-07: qty 20

## 2022-10-07 MED ORDER — MIDAZOLAM HCL 2 MG/2ML IJ SOLN
INTRAMUSCULAR | Status: DC | PRN
  Administered 2022-10-07: 2 mg via INTRAVENOUS

## 2022-10-07 MED ORDER — FENTANYL CITRATE (PF) 250 MCG/5ML IJ SOLN
INTRAMUSCULAR | Status: AC
  Filled 2022-10-07: qty 5

## 2022-10-07 MED ORDER — ONDANSETRON HCL 4 MG/2ML IJ SOLN
INTRAMUSCULAR | Status: DC | PRN
  Administered 2022-10-07: 4 mg via INTRAVENOUS

## 2022-10-07 MED ORDER — FENTANYL CITRATE (PF) 100 MCG/2ML IJ SOLN
INTRAMUSCULAR | Status: AC
  Filled 2022-10-07: qty 2

## 2022-10-07 MED ORDER — HYDROXYZINE HCL 25 MG PO TABS
25.0000 mg | ORAL_TABLET | ORAL | Status: AC
  Administered 2022-10-07: 25 mg via ORAL
  Filled 2022-10-07: qty 1

## 2022-10-07 MED ORDER — METRONIDAZOLE 500 MG/100ML IV SOLN
500.0000 mg | Freq: Two times a day (BID) | INTRAVENOUS | Status: DC
  Administered 2022-10-07 – 2022-10-08 (×2): 500 mg via INTRAVENOUS
  Filled 2022-10-07 (×2): qty 100

## 2022-10-07 MED ORDER — ORAL CARE MOUTH RINSE: 15.0000 mL | Freq: Once | OROMUCOSAL | Status: AC

## 2022-10-07 MED ORDER — PROPOFOL 10 MG/ML IV BOLUS
INTRAVENOUS | Status: DC | PRN
  Administered 2022-10-07: 200 mg via INTRAVENOUS

## 2022-10-07 MED ORDER — LIDOCAINE-EPINEPHRINE (PF) 1 %-1:200000 IJ SOLN
INTRAMUSCULAR | Status: DC | PRN
  Administered 2022-10-07: 8 mL via INTRAMUSCULAR

## 2022-10-07 SURGICAL SUPPLY — 28 items
BAG COUNTER SPONGE SURGICOUNT (BAG) ×1 IMPLANT
BNDG GAUZE DERMACEA FLUFF 4 (GAUZE/BANDAGES/DRESSINGS) IMPLANT
CANISTER SUCT 3000ML PPV (MISCELLANEOUS) ×1 IMPLANT
COVER SURGICAL LIGHT HANDLE (MISCELLANEOUS) ×1 IMPLANT
DRAPE LAPAROSCOPIC ABDOMINAL (DRAPES) IMPLANT
DRAPE LAPAROTOMY 100X72 PEDS (DRAPES) IMPLANT
ELECT CAUTERY BLADE 6.4 (BLADE) ×1 IMPLANT
ELECT REM PT RETURN 9FT ADLT (ELECTROSURGICAL) ×1
ELECTRODE REM PT RTRN 9FT ADLT (ELECTROSURGICAL) ×1 IMPLANT
GAUZE PACKING IODOFORM 1/2INX (GAUZE/BANDAGES/DRESSINGS) IMPLANT
GAUZE PAD ABD 8X10 STRL (GAUZE/BANDAGES/DRESSINGS) IMPLANT
GAUZE SPONGE 4X4 12PLY STRL (GAUZE/BANDAGES/DRESSINGS) IMPLANT
GLOVE BIO SURGEON STRL SZ7 (GLOVE) ×1 IMPLANT
GLOVE BIOGEL PI IND STRL 7.5 (GLOVE) ×1 IMPLANT
GOWN STRL REUS W/ TWL LRG LVL3 (GOWN DISPOSABLE) ×2 IMPLANT
GOWN STRL REUS W/TWL LRG LVL3 (GOWN DISPOSABLE) ×2
KIT BASIN OR (CUSTOM PROCEDURE TRAY) ×1 IMPLANT
KIT TURNOVER KIT B (KITS) ×1 IMPLANT
NS IRRIG 1000ML POUR BTL (IV SOLUTION) ×1 IMPLANT
PACK GENERAL/GYN (CUSTOM PROCEDURE TRAY) ×1 IMPLANT
PAD ARMBOARD 7.5X6 YLW CONV (MISCELLANEOUS) ×1 IMPLANT
PENCIL SMOKE EVACUATOR (MISCELLANEOUS) ×1 IMPLANT
SUT VIC AB 3-0 SH 18 (SUTURE) IMPLANT
SWAB COLLECTION DEVICE MRSA (MISCELLANEOUS) IMPLANT
SWAB CULTURE ESWAB REG 1ML (MISCELLANEOUS) IMPLANT
TAPE CLOTH SURG 4X10 WHT LF (GAUZE/BANDAGES/DRESSINGS) IMPLANT
TOWEL GREEN STERILE (TOWEL DISPOSABLE) ×1 IMPLANT
TOWEL GREEN STERILE FF (TOWEL DISPOSABLE) ×1 IMPLANT

## 2022-10-07 NOTE — Op Note (Signed)
Preoperative diagnosis: hidradenitis suppurativa, right chest wall chronic abscess, left axillary abscess Postoperative diagnosis: same as above Procedure: Incision and drainage right chest wall chronic abscess Incisoin and drainage of left axillary abscess Surgeon: Dr Serita Grammes Anesthesia general EBL minimal Specimens right chest wall chronic abscess Drains none Complications none Sponge and needle count correct Dispo recovery stable  Indications:  46yo F with PMHx DM and hidradenitis suppurativa presented to [place] with a left axillary abscess.  She was admitted by the hospitalist service and has been placed on IV vancomycin and cefepime.  I was asked to see her in consultation regarding this left axillary abscess.  She has a history of multiple subcutaneous abscesses associated with her hidradenitis.  These have required incision and drainage in the past.  She reports she has been feeling poorly for over a week in this area and her left axilla has gotten more swollen and red.  It has been draining greenish fluid.  she also has a right chest wall abscess chronically draining.  Procedure: After informed consent obtained she was taken to the OR.  She was on antibiotics.  SCDs were in place.  She was placed under general anesthesia without complication. She was prepped and draped in standard sterile surgical fashion.  Timeout was performed.  I first did the left axilla. I made an elliptical incision and included the skin connection. I then excised this chronic cavity.  I cleared all infection out.  I then close some of lateral portions and packed this with iodoform gauze.  Dressing was placed.  I then excised the chronic right chest wall wound and packed this as well. Tolerated well and was transferred to recovery.

## 2022-10-07 NOTE — Plan of Care (Signed)
  Problem: Education: Goal: Ability to describe self-care measures that may prevent or decrease complications (Diabetes Survival Skills Education) will improve Outcome: Progressing Goal: Individualized Educational Video(s) Outcome: Progressing   Problem: Fluid Volume: Goal: Ability to maintain a balanced intake and output will improve Outcome: Progressing    Problem: Health Behavior/Discharge Planning: Goal: Ability to identify and utilize available resources and services will improve Outcome: Progressing Goal: Ability to manage health-related needs will improve Outcome: Progressing   Problem: Metabolic: Goal: Ability to maintain appropriate glucose levels will improve Outcome: Progressing   Problem: Nutritional: Goal: Maintenance of adequate nutrition will improve Outcome: Progressing Goal: Progress toward achieving an optimal weight will improve Outcome: Progressing   Problem: Skin Integrity: Goal: Risk for impaired skin integrity will decrease Outcome: Progressing   Problem: Tissue Perfusion: Goal: Adequacy of tissue perfusion will improve Outcome: Progressing   Problem: Fluid Volume: Goal: Hemodynamic stability will improve Outcome: Progressing   Problem: Clinical Measurements: Goal: Diagnostic test results will improve Outcome: Progressing Goal: Signs and symptoms of infection will decrease Outcome: Progressing   Problem: Respiratory: Goal: Ability to maintain adequate ventilation will improve Outcome: Progressing   Problem: Education: Goal: Knowledge of General Education information will improve Description: Including pain rating scale, medication(s)/side effects and non-pharmacologic comfort measures Outcome: Progressing   Problem: Health Behavior/Discharge Planning: Goal: Ability to manage health-related needs will improve Outcome: Progressing   Problem: Coping: Goal: Level of anxiety will decrease Outcome: Progressing   Problem: Pain  Managment: Goal: General experience of comfort will improve Outcome: Progressing   Problem: Safety: Goal: Ability to remain free from injury will improve Outcome: Progressing

## 2022-10-07 NOTE — Anesthesia Procedure Notes (Signed)
Procedure Name: LMA Insertion Date/Time: 10/07/2022 12:39 PM  Performed by: Dorthea Cove, CRNAPre-anesthesia Checklist: Patient identified, Emergency Drugs available, Suction available and Patient being monitored Patient Re-evaluated:Patient Re-evaluated prior to induction Oxygen Delivery Method: Circle System Utilized Preoxygenation: Pre-oxygenation with 100% oxygen Induction Type: IV induction Ventilation: Mask ventilation without difficulty LMA: LMA inserted LMA Size: 4.0 Number of attempts: 1 Airway Equipment and Method: Bite block Placement Confirmation: positive ETCO2 Tube secured with: Tape Dental Injury: Teeth and Oropharynx as per pre-operative assessment

## 2022-10-07 NOTE — Progress Notes (Signed)
PROGRESS NOTE    Breanna Norton  CZY:[id number] DOB: 30-Jul-1976 DOA: 10/05/2022 PCP: [facility]   Brief Narrative:  46 y.o. female with medical history significant of IDDM noncompliant with diabetic medications, anxiety/depression/PTSD, HLD, GERD, obesity and hidradenitis suppurativa presented with fever and multiple abscesses.  On presentation, she had a temperature of 100.4 with lactic acid of 3.1.  She was started on IV fluids and antibiotics.  General surgery was consulted.  Assessment & Plan:   Sepsis: Present on admission Left axillary abscess associated with hidradenitis suppurativa/multiple skin abscesses Leukocytosis: Resolved -Currently on broad-spectrum antibiotics.  Add Flagyl for anaerobic coverage. -General surgery following and planning for surgical intervention today.  Follow cultures.  Diabetes mellitus type 2 with hyperglycemia -Used to be on Trulicity in the past but switched to Ozempic 2 months ago but ran out of her meds 3 weeks ago.  TOC consult to help with meds -A1c 10.5 -Increase long-acting insulin to 30 units daily.  Continue CBGs with SSI.  Diabetes coordinator consult  Hyponatremia -Mild.  Monitor.  Hypokalemia -Resolved  Obesity -Outpatient follow-up  PTSD -Continue Lexapro  DVT prophylaxis: Lovenox Code Status: Full Family Communication: None at bedside Disposition Plan: Status is: Inpatient Remains inpatient appropriate because: Of severe illness  Consultants: General surgery  Procedures: None  Antimicrobials: Cefepime and vancomycin from 10/06/2022 onwards   Subjective: Patient seen and examined at bedside.  No fever, chest pain, shortness of breath reported.  Objective: Vitals:   10/06/22 1643 10/06/22 1730 10/06/22 2208 10/07/22 0600  BP: 123/82 135/81 118/71 120/77  Pulse: 91 96 80 88  Resp: 19  18 18   Temp: 98.1 F (36.7 C) 98.1 F (36.7 C) 98.6 F (37 C) 98 F (36.7 C)  TempSrc: Oral Oral Oral   SpO2:  97% 98% 99% 99%  Weight:      Height:        Intake/Output Summary (Last 24 hours) at 10/07/2022 1117 Last data filed at 10/07/2022 0500 Gross per 24 hour  Intake 559.95 ml  Output --  Net 559.95 ml   Filed Weights   10/05/22 1810  Weight: 104.3 kg    Examination:  General exam: Appears calm and comfortable.  Currently on room air. Respiratory system: Bilateral decreased breath sounds at bases Cardiovascular system: S1 & S2 heard, Rate controlled Gastrointestinal system: Abdomen is nondistended, soft and nontender. Normal bowel sounds heard. Extremities: No cyanosis, clubbing, edema  Central nervous system: Alert and oriented. No focal neurological deficits. Moving extremities Skin: Left axillary abscess present Psychiatry: Judgement and insight appear normal. Mood & affect appropriate.     Data Reviewed: I have personally reviewed following labs and imaging studies  CBC: Recent Labs  Lab 10/05/22 1830 10/06/22 0806 10/07/22 0311  WBC 13.2* 8.8 7.2  NEUTROABS 8.6* 6.4  --   HGB 15.3* 14.8 14.3  HCT 43.1 42.0 41.4  MCV 92.7 93.1 94.7  PLT 246 169 093   Basic Metabolic Panel: Recent Labs  Lab 10/05/22 1847 10/07/22 0311  NA 132* 134*  K 3.3* 3.6  CL 101 104  CO2 20* 23  GLUCOSE 388* 325*  BUN <5* 6  CREATININE 0.66 0.56  CALCIUM 8.4* 8.7*   GFR: Estimated Creatinine Clearance: 108.5 mL/min (by C-G formula based on SCr of 0.56 mg/dL). Liver Function Tests: Recent Labs  Lab 10/05/22 1847  AST 29  ALT 31  ALKPHOS 115  BILITOT 0.7  PROT 6.7  ALBUMIN 3.1*   No results for input(s): "LIPASE", "AMYLASE" in  the last 168 hours. No results for input(s): "AMMONIA" in the last 168 hours. Coagulation Profile: Recent Labs  Lab 10/05/22 2003  INR 1.1   Cardiac Enzymes: No results for input(s): "CKTOTAL", "CKMB", "CKMBINDEX", "TROPONINI" in the last 168 hours. BNP (last 3 results) No results for input(s): "PROBNP" in the last 8760 hours. HbA1C: Recent  Labs    10/06/22 0806  HGBA1C 10.5*   CBG: Recent Labs  Lab 10/06/22 1201 10/06/22 2209 10/07/22 0206 10/07/22 0720 10/07/22 1045  GLUCAP 220* 372* 323* 313* 203*   Lipid Profile: No results for input(s): "CHOL", "HDL", "LDLCALC", "TRIG", "CHOLHDL", "LDLDIRECT" in the last 72 hours. Thyroid Function Tests: No results for input(s): "TSH", "T4TOTAL", "FREET4", "T3FREE", "THYROIDAB" in the last 72 hours. Anemia Panel: No results for input(s): "VITAMINB12", "FOLATE", "FERRITIN", "TIBC", "IRON", "RETICCTPCT" in the last 72 hours. Sepsis Labs: Recent Labs  Lab 10/05/22 1830 10/06/22 0438  LATICACIDVEN 3.1* 1.7    Recent Results (from the past 240 hour(s))  Culture, blood (Routine x 2)     Status: None (Preliminary result)   Collection Time: 10/05/22  6:30 PM   Specimen: BLOOD RIGHT HAND  Result Value Ref Range Status   Specimen Description   Final    BLOOD RIGHT HAND Performed at [facility], [address] [place], [place] [address]    Special Requests   Final    BOTTLES DRAWN AEROBIC AND ANAEROBIC Blood Culture adequate volume Performed at [facility], [address] [place], [place] [address]    Culture   Final    NO GROWTH 2 DAYS Performed at [facility], [place] [address], [place], [place] [address]    Report Status PENDING  Incomplete  Culture, blood (Routine x 2)     Status: None (Preliminary result)   Collection Time: 10/05/22  6:32 PM   Specimen: BLOOD  Result Value Ref Range Status   Specimen Description   Final    BLOOD RIGHT ANTECUBITAL Performed at [facility] [address] [place], [place] [address]    Special Requests   Final    BOTTLES DRAWN AEROBIC AND ANAEROBIC Blood Culture adequate volume Performed at [facility], [address] [place], [place] [address]    Culture   Final    NO GROWTH 2 DAYS Performed at [facility], [place] [address], [place], [place] [address]    Report  Status PENDING  Incomplete  SARS Coronavirus 2 by RT PCR (hospital order, performed in [facility] hospital lab) *cepheid single result test* Anterior Nasal Swab     Status: None   Collection Time: 10/05/22  6:47 PM   Specimen: Anterior Nasal Swab  Result Value Ref Range Status   SARS Coronavirus 2 by RT PCR NEGATIVE NEGATIVE Final    Comment: (NOTE) SARS-CoV-2 target nucleic acids are NOT DETECTED.  The SARS-CoV-2 RNA is generally detectable in upper and lower respiratory specimens during the acute phase of infection. The lowest concentration of SARS-CoV-2 viral copies this assay can detect is 250 copies / mL. A negative result does not preclude SARS-CoV-2 infection and should not be used as the sole basis for treatment or other patient management decisions.  A negative result may occur with improper specimen collection / handling, submission of specimen other than nasopharyngeal swab, presence of viral mutation(s) within the areas targeted by this assay, and inadequate number of viral copies (<250 copies / mL). A negative  result must be combined with clinical observations, patient history, and epidemiological information.  Fact Sheet for Patients:   https://www.patel.info/  Fact Sheet for Healthcare Providers: https://hall.com/  This test is not yet approved or  cleared by the [place] FDA and has been authorized for detection and/or diagnosis of SARS-CoV-2 by FDA under an Emergency Use Authorization (EUA).  This EUA will remain in effect (meaning this test can be used) for the duration of the COVID-19 declaration under Section 564(b)(1) of the Act, 21 U.S.C. section 360bbb-3(b)(1), unless the authorization is terminated or revoked sooner.  Performed at [facility], [address] [place], [place] [address]   MRSA Next Gen by PCR, Nasal     Status: None   Collection Time: 10/06/22  8:33 PM   Specimen: Nasal Mucosa;  Nasal Swab  Result Value Ref Range Status   MRSA by PCR Next Gen NOT DETECTED NOT DETECTED Final    Comment: (NOTE) The GeneXpert MRSA Assay (FDA approved for NASAL specimens only), is one component of a comprehensive MRSA colonization surveillance program. It is not intended to diagnose MRSA infection nor to guide or monitor treatment for MRSA infections. Test performance is not FDA approved in patients less than 71 years old. Performed at [facility], [place] [address], [place], [place] [address]          Radiology Studies: No results found.      Scheduled Meds:  [MAR Hold] acidophilus  1 capsule Oral Daily   [MAR Hold] enoxaparin (LOVENOX) injection  50 mg Subcutaneous Q24H   [MAR Hold] escitalopram  20 mg Oral Daily   [MAR Hold] insulin aspart  0-20 Units Subcutaneous TID WC   [MAR Hold] insulin aspart  0-5 Units Subcutaneous QHS   [MAR Hold] insulin glargine-yfgn  20 Units Subcutaneous Daily   Continuous Infusions:  [MAR Hold] ceFEPime (MAXIPIME) IV 2 g (10/07/22 0807)   lactated ringers 10 mL/hr at 10/07/22 1109   [MAR Hold] vancomycin 1,000 mg (10/07/22 0530)          Aline August, MD [facility] 10/07/2022, 11:17 AM

## 2022-10-07 NOTE — Transfer of Care (Signed)
Immediate Anesthesia Transfer of Care Note  Patient: Breanna Norton  Procedure(s) Performed: INCISION AND DRAINAGE AXILLARY ABSCESS (Left)  Patient Location: PACU  Anesthesia Type:General  Level of Consciousness: awake, alert , and oriented  Airway & Oxygen Therapy: Patient Spontanous Breathing  Post-op Assessment: Report given to RN and Post -op Vital signs reviewed and stable  Post vital signs: Reviewed and stable  Last Vitals:  Vitals Value Taken Time  BP 141/80 10/07/22 1322  Temp    Pulse 80 10/07/22 1325  Resp 12   SpO2 94 % 10/07/22 1325  Vitals shown include unvalidated device data.  Last Pain:  Vitals:   10/07/22 1103  TempSrc:   PainSc: 0-No pain         Complications: No notable events documented.

## 2022-10-07 NOTE — Interval H&P Note (Signed)
History and Physical Interval Note:  10/07/2022 12:07 PM  Breanna Norton  has presented today for surgery, with the diagnosis of axillary abscess.  The various methods of treatment have been discussed with the patient and family. After consideration of risks, benefits and other options for treatment, the patient has consented to  Procedure(s): INCISION AND DRAINAGE AXILLARY ABSCESS (Left) as a surgical intervention.  The patient's history has been reviewed, patient examined, no change in status, stable for surgery.  I have reviewed the patient's chart and labs.  Questions were answered to the patient's satisfaction.     Rolm Bookbinder

## 2022-10-07 NOTE — Anesthesia Preprocedure Evaluation (Signed)
Anesthesia Evaluation    Reviewed: Allergy & Precautions, H&P , Patient's Chart, lab work & pertinent test results  Airway Mallampati: II  TM Distance: >3 FB Neck ROM: Full    Dental no notable dental hx.    Pulmonary neg pulmonary ROS, Current Smoker   Pulmonary exam normal breath sounds clear to auscultation       Cardiovascular Exercise Tolerance: Good negative cardio ROS Normal cardiovascular exam Rhythm:Regular Rate:Normal     Neuro/Psych  PSYCHIATRIC DISORDERS Anxiety     negative neurological ROS  negative psych ROS   GI/Hepatic Neg liver ROS,GERD  Medicated,,  Endo/Other  negative endocrine ROSdiabetes, Well Controlled    Renal/GU negative Renal ROS  negative genitourinary   Musculoskeletal   Abdominal   Peds  Hematology negative hematology ROS (+)   Anesthesia Other Findings   Reproductive/Obstetrics negative OB ROS                              Anesthesia Physical Anesthesia Plan  ASA: 3  Anesthesia Plan: General   Post-op Pain Management: Toradol IV (intra-op)*   Induction: Intravenous  PONV Risk Score and Plan: 3 and Ondansetron, Dexamethasone and Treatment may vary due to age or medical condition  Airway Management Planned: LMA and Oral ETT  Additional Equipment: None  Intra-op Plan:   Post-operative Plan: Extubation in OR  Informed Consent: I have reviewed the patients History and Physical, chart, labs and discussed the procedure including the risks, benefits and alternatives for the proposed anesthesia with the patient or authorized representative who has indicated his/her understanding and acceptance.       Plan Discussed with: CRNA, Surgeon and Anesthesiologist  Anesthesia Plan Comments: ( )         Anesthesia Quick Evaluation

## 2022-10-07 NOTE — Anesthesia Postprocedure Evaluation (Signed)
Anesthesia Post Note  Patient: Justice Deeds  Procedure(s) Performed: INCISION AND DRAINAGE AXILLARY ABSCESS (Left)     Patient location during evaluation: PACU Anesthesia Type: General Level of consciousness: awake and alert Pain management: pain level controlled Vital Signs Assessment: post-procedure vital signs reviewed and stable Respiratory status: spontaneous breathing, nonlabored ventilation, respiratory function stable and patient connected to nasal cannula oxygen Cardiovascular status: blood pressure returned to baseline and stable Postop Assessment: no apparent nausea or vomiting Anesthetic complications: no   No notable events documented.  Last Vitals:  Vitals:   10/07/22 0600 10/07/22 1322  BP: 120/77   Pulse: 88   Resp: 18   Temp: 36.7 C 36.6 C  SpO2: 99%     Last Pain:  Vitals:   10/07/22 1103  TempSrc:   PainSc: 0-No pain                 Bryana Froemming

## 2022-10-07 NOTE — Progress Notes (Signed)
[facility] Progress Note  Day of Surgery  Subjective: CC-  Pt. States pain has improved, is able to ambulate, denies any fever, chills, nausea, vomiting,   Objective: Vital signs in last 24 hours: Temp:  [98 F (36.7 C)-98.6 F (37 C)] 98 F (36.7 C) (11/07 0600) Pulse Rate:  [74-96] 88 (11/07 0600) Resp:  [15-23] 18 (11/07 0600) BP: (98-135)/(56-82) 120/77 (11/07 0600) SpO2:  [95 %-100 %] 99 % (11/07 0600) Last BM Date : 10/03/22  Intake/Output from previous day: 11/06 0701 - 11/07 0700 In: 1036.5 [P.O.:355; IV Piggyback:681.5] Out: -  Intake/Output this shift: No intake/output data recorded.  PE: Gen:  Alert, NAD, pleasant Skin: Left axilla with large erythematous area of induration and cellulitis, tender to palpation  Small erythematous skin lesions on R lateral abdomen without significant cellulitis, tender to palpation  Lab Results:  Recent Labs    10/06/22 0806 10/07/22 0311  WBC 8.8 7.2  HGB 14.8 14.3  HCT 42.0 41.4  PLT 169 215   BMET Recent Labs    10/05/22 1847 10/07/22 0311  NA 132* 134*  K 3.3* 3.6  CL 101 104  CO2 20* 23  GLUCOSE 388* 325*  BUN <5* 6  CREATININE 0.66 0.56  CALCIUM 8.4* 8.7*   PT/INR Recent Labs    10/05/22 2003  LABPROT 13.8  INR 1.1   CMP     Component Value Date/Time   NA 134 (L) 10/07/2022 0311   K 3.6 10/07/2022 0311   CL 104 10/07/2022 0311   CO2 23 10/07/2022 0311   GLUCOSE 325 (H) 10/07/2022 0311   BUN 6 10/07/2022 0311   CREATININE 0.56 10/07/2022 0311   CALCIUM 8.7 (L) 10/07/2022 0311   PROT 6.7 10/05/2022 1847   ALBUMIN 3.1 (L) 10/05/2022 1847   AST 29 10/05/2022 1847   ALT 31 10/05/2022 1847   ALKPHOS 115 10/05/2022 1847   BILITOT 0.7 10/05/2022 1847   GFRNONAA >60 10/07/2022 0311   GFRAA >60 07/26/2020 1458   Lipase     Component Value Date/Time   LIPASE 36 09/20/2022 1146       Studies/Results: No results found.  Anti-infectives: Anti-infectives (From admission,  onward)    Start     Dose/Rate Route Frequency Ordered Stop   10/07/22 0800  ceFEPIme (MAXIPIME) 2 g in sodium chloride 0.9 % 100 mL IVPB        2 g 200 mL/hr over 30 Minutes Intravenous Every 8 hours 10/07/22 0123     10/06/22 1800  vancomycin (VANCOREADY) IVPB 1500 mg/300 mL  Status:  Discontinued        1,500 mg 150 mL/hr over 120 Minutes Intravenous Every 12 hours 10/06/22 0538 10/06/22 0826   10/06/22 1800  vancomycin (VANCOCIN) IVPB 1000 mg/200 mL premix        1,000 mg 200 mL/hr over 60 Minutes Intravenous Every 12 hours 10/06/22 0826     10/06/22 1000  ceFEPIme (MAXIPIME) 2 g in sodium chloride 0.9 % 100 mL IVPB  Status:  Discontinued        2 g 200 mL/hr over 30 Minutes Intravenous Every 12 hours 10/06/22 0839 10/06/22 0856   10/06/22 1000  ceFEPIme (MAXIPIME) 2 g in sodium chloride 0.9 % 100 mL IVPB  Status:  Discontinued        2 g 200 mL/hr over 30 Minutes Intravenous Every 8 hours 10/06/22 0856 10/07/22 0123   10/06/22 0900  metroNIDAZOLE (FLAGYL) IVPB 500 mg  Status:  Discontinued  500 mg 100 mL/hr over 60 Minutes Intravenous Every 12 hours 10/06/22 0856 10/06/22 1717   10/06/22 0845  metroNIDAZOLE (FLAGYL) IVPB 500 mg  Status:  Discontinued        500 mg 100 mL/hr over 60 Minutes Intravenous  Once 10/06/22 0837 10/06/22 0856   10/06/22 0430  vancomycin (VANCOCIN) IVPB 1000 mg/200 mL premix        1,000 mg 200 mL/hr over 60 Minutes Intravenous Every 1 hr x 2 10/06/22 0427 10/06/22 0837   10/05/22 1845  ceFEPIme (MAXIPIME) 2 g in sodium chloride 0.9 % 100 mL IVPB        2 g 200 mL/hr over 30 Minutes Intravenous  Once 10/05/22 1836 10/05/22 2015        Assessment/Plan  Left axillary abscess associated with hidradenitis suppurativa  Patient spoken to about incision and drainage in operating room and she is agreeable. Orders have been placed for patient to sign consent for surgery.  ID - Cefepime FEN - NPO VTE - enoxaparin  Foley - none   I reviewed last  24 h vitals and pain scores, last 48 h intake and output, last 24 h labs and trends, and last 24 h imaging results.    LOS: 1 day    Earmon Phoenix, PA-S [facility] Surgery 10/07/2022, 9:29 AM

## 2022-10-07 NOTE — Inpatient Diabetes Management (Signed)
Inpatient Diabetes Program Recommendations  AACE/ADA: New Consensus Statement on Inpatient Glycemic Control (2015)  Target Ranges:  Prepandial:   less than 140 mg/dL      Peak postprandial:   less than 180 mg/dL (1-2 hours)      Critically ill patients:  140 - 180 mg/dL   Lab Results  Component Value Date   GLUCAP 313 (H) 10/07/2022   HGBA1C 10.5 (H) 10/06/2022    Review of Glycemic Control  Latest Reference Range & Units 10/06/22 04:59 10/06/22 08:08 10/06/22 09:42 10/06/22 12:01 10/06/22 22:09 10/07/22 02:06 10/07/22 07:20  Glucose-Capillary 70 - 99 mg/dL 388 (H) 385 (H) 285 (H) 220 (H) 372 (H) 323 (H) 313 (H)   Diabetes history: DM 2 Outpatient Diabetes medications: Metformin, Januvia, glipizide, Ozempic weekly Current orders for Inpatient glycemic control:  Semglee 20- units (increased today from 15 units) Novolog 0-20 units tid + hs  A1c 10.5% on 11/6  Inpatient Diabetes Program Recommendations:    Glucose trends mostly in the 300 range. Received Novolog 50 units in the last 24 hours  -  Increase Semglee to 30 units  Will speak with pt.  Thanks,  Tama Headings RN, MSN, BC-ADM Inpatient Diabetes Coordinator Team Pager [phone] (8a-5p)

## 2022-10-08 ENCOUNTER — Other Ambulatory Visit (HOSPITAL_COMMUNITY): Payer: Self-pay

## 2022-10-08 ENCOUNTER — Telehealth (HOSPITAL_COMMUNITY): Payer: Self-pay | Admitting: Pharmacy Technician

## 2022-10-08 ENCOUNTER — Encounter (HOSPITAL_COMMUNITY): Payer: Self-pay | Admitting: General Surgery

## 2022-10-08 DIAGNOSIS — D72829 Elevated white blood cell count, unspecified: Secondary | ICD-10-CM | POA: Diagnosis not present

## 2022-10-08 DIAGNOSIS — L02419 Cutaneous abscess of limb, unspecified: Secondary | ICD-10-CM | POA: Diagnosis not present

## 2022-10-08 DIAGNOSIS — E1165 Type 2 diabetes mellitus with hyperglycemia: Secondary | ICD-10-CM

## 2022-10-08 DIAGNOSIS — A419 Sepsis, unspecified organism: Secondary | ICD-10-CM | POA: Diagnosis not present

## 2022-10-08 LAB — GLUCOSE, CAPILLARY
Glucose-Capillary: 317 mg/dL — ABNORMAL HIGH (ref 70–99)
Glucose-Capillary: 230 mg/dL — ABNORMAL HIGH (ref 70–99)

## 2022-10-08 LAB — SURGICAL PATHOLOGY

## 2022-10-08 MED ORDER — INSULIN GLARGINE 100 UNIT/ML SOLOSTAR PEN: 30.0000 [IU] | PEN_INJECTOR | Freq: Every day | SUBCUTANEOUS | 0 refills | Status: AC

## 2022-10-08 MED ORDER — ACETAMINOPHEN 325 MG PO TABS
650.0000 mg | ORAL_TABLET | Freq: Four times a day (QID) | ORAL | Status: DC | PRN
  Administered 2022-10-08: 650 mg via ORAL
  Filled 2022-10-08: qty 2

## 2022-10-08 MED ORDER — INSULIN PEN NEEDLE 32G X 4 MM MISC: 0 refills | Status: AC

## 2022-10-08 MED ORDER — ACETAMINOPHEN 500 MG PO TABS: 1000.0000 mg | ORAL_TABLET | Freq: Four times a day (QID) | ORAL | Status: AC | PRN

## 2022-10-08 NOTE — Discharge Summary (Addendum)
Physician Discharge Summary  Breanna Norton ZOX:[id number] DOB: 09/07/1976 DOA: 10/05/2022  PCP: [facility]  Admit date: 10/05/2022 Discharge date: 10/08/2022  Admitted From: Home Disposition: Home  Recommendations for Outpatient Follow-up:  Follow up with PCP in 1 week  Outpatient follow-up with general surgery.  Wound care as per general surgery recommendations  Comply with medications and follow-up  follow up in ED if symptoms worsen or new appear   Home Health: No Equipment/Devices: None  Discharge Condition: Stable CODE STATUS: Full Diet recommendation: Carb modified  Brief/Interim Summary: 46 y.o. female with medical history significant of IDDM noncompliant with diabetic medications, anxiety/depression/PTSD, HLD, GERD, obesity and hidradenitis suppurativa presented with fever and multiple abscesses.  On presentation, she had a temperature of 100.4 with lactic acid of 3.1.  She was started on IV fluids and antibiotics.  General surgery was consulted.  She underwent I&D of right chest wall chronic abscess and of left axillary abscess.  Subsequently, she has remained stable.  General surgery has cleared the patient for discharge and recommended no further need for antibiotics.  Diabetes coordinator has evaluated the patient.  She will be discharged on long-acting insulin.  She needs to be compliant with medications including insulin and follow-up.  Outpatient follow-up with PCP and general surgery.    Discharge Diagnoses:   Severe sepsis: Present on admission: Resolved Left axillary abscess associated with hidradenitis suppurativa/multiple skin abscesses Leukocytosis: Resolved -Currently on broad-spectrum antibiotics -She underwent I&D of right chest wall chronic abscess and of left axillary abscess.  Subsequently, she has remained stable.  General surgery has cleared the patient for discharge and recommended no further need for antibiotics.  Cultures negative so  far. -Discharge patient home today.  Discharge wound care and pain management as per general surgery.  Outpatient follow-up with general surgery.   Diabetes mellitus type 2 with hyperglycemia -Used to be on Trulicity in the past but switched to Ozempic 2 months ago but ran out of her meds 3 weeks ago.  TOC consulted  -A1c 10.5 -Currently on long-acting insulin along with CBGs with [facility] - diabetes coordinator following. -Discharge patient on long-acting insulin.  Carb modified diet.  Outpatient follow-up with PCP.  Comply with medications and follow-up.  Hyponatremia -Mild.  No labs today.   Hypokalemia -Resolved   Obesity -Outpatient follow-up   PTSD -Continue Lexapro  Discharge Instructions   Allergies as of 10/08/2022       Reactions   Keflex [cephalexin] Hives   Tolerated cefepime 10/2022 admission   Metformin Diarrhea   Morphine And Related Other (See Comments)   Blacked out   Penicillins Hives   Tolerated cefepime 10/2022 admission        Medication List     STOP taking these medications    naproxen sodium 220 MG tablet Commonly known as: ALEVE   ondansetron 4 MG tablet Commonly known as: ZOFRAN       TAKE these medications    acetaminophen 500 MG tablet Commonly known as: TYLENOL Take 2 tablets (1,000 mg total) by mouth every 6 (six) hours as needed for mild pain, fever or headache.   diclofenac Sodium 1 % Gel Commonly known as: VOLTAREN Apply 2 g topically every 6 (six) hours as needed (pain).   escitalopram 20 MG tablet Commonly known as: LEXAPRO Take 20 mg by mouth daily.   ibuprofen 200 MG tablet Commonly known as: ADVIL Take 800 mg by mouth every 6 (six) hours as needed for moderate pain.   insulin  glargine 100 UNIT/ML Solostar Pen Commonly known as: LANTUS Inject 30 Units into the skin daily.   Insulin Pen Needle 32G X 4 MM Misc Lantus 30 units subcutaneous daily               Discharge Care Instructions  (From  admission, onward)           Start     Ordered   10/08/22 0000  Discharge wound care:       Comments: As per general surgery recommendations   10/08/22 1152             Follow-up Information     Maczis, Hedda Slade, PA-C Follow up on 10/28/2022.   Specialty: General Surgery Why: 11 am, Arrive 30 minutes prior to your appointment time, Please bring your insurance card and photo ID Contact information: [address] [facility] [place] [address] [phone]         [facility]. Schedule an appointment as soon as possible for a visit in 1 week(s).   Contact information: [address] [place] [address] [phone]                Allergies  Allergen Reactions   Keflex [Cephalexin] Hives    Tolerated cefepime 10/2022 admission   Metformin Diarrhea   Morphine And Related Other (See Comments)    Blacked out   Penicillins Hives    Tolerated cefepime 10/2022 admission    Consultations: General surgery   Procedures/Studies: DG Abdomen 1 View  Result Date: 09/20/2022 CLINICAL DATA:  Abdominal pain and vomiting. EXAM: ABDOMEN - 1 VIEW COMPARISON:  04/13/2018 CT FINDINGS: The bowel gas pattern is normal. No radio-opaque calculi or other significant radiographic abnormality are seen. Cholecystectomy clips are again noted. IMPRESSION: Negative. Electronically Signed   By: Harmon Pier M.D.   On: 09/20/2022 13:21      Subjective: Patient seen and examined at bedside.  Feels okay to go home today.  No fever, vomiting, worsening shortness of breath reported.  Discharge Exam: Vitals:   10/08/22 0740 10/08/22 0800  BP: 128/76 128/76  Pulse: 76 76  Resp:  16  Temp: 98.3 F (36.8 C) 98.3 F (36.8 C)  SpO2: 97% 97%    General: Pt is alert, awake, not in acute distress.  On room air currently. Cardiovascular: rate controlled, S1/S2 + Respiratory: bilateral decreased breath sounds at bases Abdominal:  Soft, NT, ND, bowel sounds + Extremities: no edema, no cyanosis    The results of significant diagnostics from this hospitalization (including imaging, microbiology, ancillary and laboratory) are listed below for reference.     Microbiology: Recent Results (from the past 240 hour(s))  Culture, blood (Routine x 2)     Status: None (Preliminary result)   Collection Time: 10/05/22  6:30 PM   Specimen: BLOOD RIGHT HAND  Result Value Ref Range Status   Specimen Description   Final    BLOOD RIGHT HAND Performed at [facility], [address] [place], [place] [address]    Special Requests   Final    BOTTLES DRAWN AEROBIC AND ANAEROBIC Blood Culture adequate volume Performed at [facility], [address] [place], [place] [address]    Culture   Final    NO GROWTH 2 DAYS Performed at [facility], [address], [place], [place] [address]    Report Status PENDING  Incomplete  Culture, blood (Routine x 2)  Status: None (Preliminary result)   Collection Time: 10/05/22  6:32 PM   Specimen: BLOOD  Result Value Ref Range Status   Specimen Description   Final    BLOOD RIGHT ANTECUBITAL Performed at [facility], [address] [place], [place] [address]    Special Requests   Final    BOTTLES DRAWN AEROBIC AND ANAEROBIC Blood Culture adequate volume Performed at [facility], [address] [place], [place] [address]    Culture   Final    NO GROWTH 2 DAYS Performed at [facility], [address], [place], [place] [address]    Report Status PENDING  Incomplete  SARS Coronavirus 2 by RT PCR (hospital order, performed in [facility] hospital lab) *cepheid single result test* Anterior Nasal Swab     Status: None   Collection Time: 10/05/22  6:47 PM   Specimen: Anterior Nasal Swab  Result Value Ref Range Status   SARS Coronavirus 2 by RT PCR NEGATIVE NEGATIVE Final    Comment: (NOTE) SARS-CoV-2 target nucleic acids are  NOT DETECTED.  The SARS-CoV-2 RNA is generally detectable in upper and lower respiratory specimens during the acute phase of infection. The lowest concentration of SARS-CoV-2 viral copies this assay can detect is 250 copies / mL. A negative result does not preclude SARS-CoV-2 infection and should not be used as the sole basis for treatment or other patient management decisions.  A negative result may occur with improper specimen collection / handling, submission of specimen other than nasopharyngeal swab, presence of viral mutation(s) within the areas targeted by this assay, and inadequate number of viral copies (<250 copies / mL). A negative result must be combined with clinical observations, patient history, and epidemiological information.  Fact Sheet for Patients:   RoadLapTop.co.za  Fact Sheet for Healthcare Providers: http://kim-miller.com/  This test is not yet approved or  cleared by the [place] FDA and has been authorized for detection and/or diagnosis of SARS-CoV-2 by FDA under an Emergency Use Authorization (EUA).  This EUA will remain in effect (meaning this test can be used) for the duration of the COVID-19 declaration under Section 564(b)(1) of the Act, 21 U.S.C. section 360bbb-3(b)(1), unless the authorization is terminated or revoked sooner.  Performed at [facility], [address] [place], [place] [address]   MRSA Next Gen by PCR, Nasal     Status: None   Collection Time: 10/06/22  8:33 PM   Specimen: Nasal Mucosa; Nasal Swab  Result Value Ref Range Status   MRSA by PCR Next Gen NOT DETECTED NOT DETECTED Final    Comment: (NOTE) The GeneXpert MRSA Assay (FDA approved for NASAL specimens only), is one component of a comprehensive MRSA colonization surveillance program. It is not intended to diagnose MRSA infection nor to guide or monitor treatment for MRSA infections. Test performance is not FDA  approved in patients less than 12 years old. Performed at [facility], [address] [place], [place] [address]      Labs: BNP (last 3 results) No results for input(s): "BNP" in the last 8760 hours. Basic Metabolic Panel: Recent Labs  Lab 10/05/22 1847 10/07/22 0311  NA 132* 134*  K 3.3* 3.6  CL 101 104  CO2 20* 23  GLUCOSE 388* 325*  BUN <5* 6  CREATININE 0.66 0.56  CALCIUM 8.4* 8.7*   Liver Function Tests: Recent Labs  Lab 10/05/22 1847  AST 29  ALT 31  ALKPHOS 115  BILITOT 0.7  PROT 6.7  ALBUMIN 3.1*   No results for input(s): "LIPASE", "AMYLASE" in the last 168 hours. No results for input(s): "AMMONIA" in the last 168 hours. CBC: Recent Labs  Lab 10/05/22 1830 10/06/22 0806 10/07/22 0311  WBC 13.2* 8.8 7.2  NEUTROABS 8.6* 6.4  --   HGB 15.3* 14.8 14.3  HCT 43.1 42.0 41.4  MCV 92.7 93.1 94.7  PLT 246 169 215   Cardiac Enzymes: No results for input(s): "CKTOTAL", "CKMB", "CKMBINDEX", "TROPONINI" in the last 168 hours. BNP: Invalid input(s): "POCBNP" CBG: Recent Labs  Lab 10/07/22 1327 10/07/22 1449 10/07/22 1628 10/07/22 2016 10/08/22 0721  GLUCAP 166* 170* 195* 357* 317*   D-Dimer No results for input(s): "DDIMER" in the last 72 hours. Hgb A1c Recent Labs    10/06/22 0806  HGBA1C 10.5*   Lipid Profile No results for input(s): "CHOL", "HDL", "LDLCALC", "TRIG", "CHOLHDL", "LDLDIRECT" in the last 72 hours. Thyroid function studies No results for input(s): "TSH", "T4TOTAL", "T3FREE", "THYROIDAB" in the last 72 hours.  Invalid input(s): "FREET3" Anemia work up No results for input(s): "VITAMINB12", "FOLATE", "FERRITIN", "TIBC", "IRON", "RETICCTPCT" in the last 72 hours. Urinalysis    Component Value Date/Time   COLORURINE YELLOW 10/05/2022 1830   APPEARANCEUR HAZY (A) 10/05/2022 1830   LABSPEC 1.010 10/05/2022 1830   PHURINE 7.0 10/05/2022 1830   GLUCOSEU >=500 (A) 10/05/2022 1830   HGBUR TRACE (A) 10/05/2022 1830    BILIRUBINUR NEGATIVE 10/05/2022 1830   KETONESUR NEGATIVE 10/05/2022 1830   PROTEINUR NEGATIVE 10/05/2022 1830   UROBILINOGEN 0.2 12/15/2009 2154   NITRITE NEGATIVE 10/05/2022 1830   LEUKOCYTESUR NEGATIVE 10/05/2022 1830   Sepsis Labs Recent Labs  Lab 10/05/22 1830 10/06/22 0806 10/07/22 0311  WBC 13.2* 8.8 7.2   Microbiology Recent Results (from the past 240 hour(s))  Culture, blood (Routine x 2)     Status: None (Preliminary result)   Collection Time: 10/05/22  6:30 PM   Specimen: BLOOD RIGHT HAND  Result Value Ref Range Status   Specimen Description   Final    BLOOD RIGHT HAND Performed at [facility], [address] [place], [place] [address]    Special Requests   Final    BOTTLES DRAWN AEROBIC AND ANAEROBIC Blood Culture adequate volume Performed at [facility], [address] [place], [place] [address]    Culture   Final    NO GROWTH 2 DAYS Performed at [facility], [address], [place], [place] [address]    Report Status PENDING  Incomplete  Culture, blood (Routine x 2)     Status: None (Preliminary result)   Collection Time: 10/05/22  6:32 PM   Specimen: BLOOD  Result Value Ref Range Status   Specimen Description   Final    BLOOD RIGHT ANTECUBITAL Performed at [facility], [address] [place], [place] [address]    Special Requests   Final    BOTTLES DRAWN AEROBIC AND ANAEROBIC Blood Culture adequate volume Performed at [facility], [address] [place], [place] [address]    Culture   Final    NO GROWTH 2 DAYS Performed at [facility], [address], [place], [place] [address]    Report Status PENDING  Incomplete  SARS Coronavirus 2 by RT PCR (hospital order, performed in [facility] hospital lab) *cepheid single result test* Anterior Nasal Swab     Status: None  Collection Time: 10/05/22  6:47 PM   Specimen: Anterior Nasal Swab  Result Value Ref Range Status   SARS Coronavirus 2  by RT PCR NEGATIVE NEGATIVE Final    Comment: (NOTE) SARS-CoV-2 target nucleic acids are NOT DETECTED.  The SARS-CoV-2 RNA is generally detectable in upper and lower respiratory specimens during the acute phase of infection. The lowest concentration of SARS-CoV-2 viral copies this assay can detect is 250 copies / mL. A negative result does not preclude SARS-CoV-2 infection and should not be used as the sole basis for treatment or other patient management decisions.  A negative result may occur with improper specimen collection / handling, submission of specimen other than nasopharyngeal swab, presence of viral mutation(s) within the areas targeted by this assay, and inadequate number of viral copies (<250 copies / mL). A negative result must be combined with clinical observations, patient history, and epidemiological information.  Fact Sheet for Patients:   RoadLapTop.co.za  Fact Sheet for Healthcare Providers: http://kim-miller.com/  This test is not yet approved or  cleared by the [place] FDA and has been authorized for detection and/or diagnosis of SARS-CoV-2 by FDA under an Emergency Use Authorization (EUA).  This EUA will remain in effect (meaning this test can be used) for the duration of the COVID-19 declaration under Section 564(b)(1) of the Act, 21 U.S.C. section 360bbb-3(b)(1), unless the authorization is terminated or revoked sooner.  Performed at [facility], [address] [place], [place] [address]   MRSA Next Gen by PCR, Nasal     Status: None   Collection Time: 10/06/22  8:33 PM   Specimen: Nasal Mucosa; Nasal Swab  Result Value Ref Range Status   MRSA by PCR Next Gen NOT DETECTED NOT DETECTED Final    Comment: (NOTE) The GeneXpert MRSA Assay (FDA approved for NASAL specimens only), is one component of a comprehensive MRSA colonization surveillance program. It is not intended to diagnose MRSA  infection nor to guide or monitor treatment for MRSA infections. Test performance is not FDA approved in patients less than 24 years old. Performed at [facility], [address], [place], [place] [address]      Time coordinating discharge: 35 minutes  SIGNED:   Glade Lloyd, MD  [facility] 10/08/2022, 10:56 AM

## 2022-10-08 NOTE — Plan of Care (Signed)
  Problem: Education: Goal: Ability to describe self-care measures that may prevent or decrease complications (Diabetes Survival Skills Education) will improve Outcome: Progressing Goal: Individualized Educational Video(s) Outcome: Progressing   Problem: Coping: Goal: Ability to adjust to condition or change in health will improve Outcome: Progressing   Problem: Fluid Volume: Goal: Ability to maintain a balanced intake and output will improve Outcome: Progressing   Problem: Health Behavior/Discharge Planning: Goal: Ability to identify and utilize available resources and services will improve Outcome: Progressing Goal: Ability to manage health-related needs will improve Outcome: Progressing   Problem: Metabolic: Goal: Ability to maintain appropriate glucose levels will improve Outcome: Progressing   Problem: Nutritional: Goal: Maintenance of adequate nutrition will improve Outcome: Progressing Goal: Progress toward achieving an optimal weight will improve Outcome: Progressing   Problem: Skin Integrity: Goal: Risk for impaired skin integrity will decrease Outcome: Progressing   Problem: Tissue Perfusion: Goal: Adequacy of tissue perfusion will improve Outcome: Progressing   Problem: Fluid Volume: Goal: Hemodynamic stability will improve Outcome: Progressing   Problem: Clinical Measurements: Goal: Diagnostic test results will improve Outcome: Progressing Goal: Signs and symptoms of infection will decrease Outcome: Progressing   Problem: Respiratory: Goal: Ability to maintain adequate ventilation will improve Outcome: Progressing   Problem: Education: Goal: Knowledge of General Education information will improve Description: Including pain rating scale, medication(s)/side effects and non-pharmacologic comfort measures Outcome: Progressing   Problem: Health Behavior/Discharge Planning: Goal: Ability to manage health-related needs will improve Outcome:  Progressing   Problem: Coping: Goal: Level of anxiety will decrease Outcome: Progressing   Problem: Pain Managment: Goal: General experience of comfort will improve Outcome: Progressing   Problem: Safety: Goal: Ability to remain free from injury will improve Outcome: Progressing

## 2022-10-08 NOTE — TOC Transition Note (Signed)
Transition of Care [facility]) - CM/SW Discharge Note   Patient Details  Name: Breanna Norton MRN: [id number] Date of Birth: 1976-10-01  [facility] CM/SW Contact:  Tom-Johnson, Hershal Coria, RN Phone Number: 10/08/2022, 12:33 PM   Clinical Narrative:     Patient is scheduled for discharge today. Benefit check done for insulin- see Pharmacy notes. Diabetes education done by Diabetic Coordinator.  CM consulted for new PCP. CM went to speak with patient but patient states she had already found one and CM updated PCP in Epic. No other needs noted at this time. Husband to transport at discharge. No further TOC needs noted.     Final next level of care: Home/Self Care Barriers to Discharge: Barriers Resolved   Patient Goals and CMS Choice Patient states their goals for this hospitalization and ongoing recovery are:: to return home CMS Medicare.gov Compare Post Acute Care list provided to:: Patient Choice offered to / list presented to : Patient  Discharge Placement                Patient to be transferred to facility by: Husband      Discharge Plan and Services In-house Referral: PCP / [facility] Discharge Planning Services: CM Consult            DME Arranged: N/A DME Agency: NA       HH Arranged: NA HH Agency: NA        Social Determinants of Health (SDOH) Interventions     Readmission Risk Interventions     No data to display

## 2022-10-08 NOTE — Progress Notes (Addendum)
[facility] Progress Note  1 Day Post-Op  Subjective: CC-  Pain is improving, packing removed today. Pt. is able to ambulate and denies fever, chills, nausea, vomiting  Objective: Vital signs in last 24 hours: Temp:  [97.5 F (36.4 C)-98.3 F (36.8 C)] 98.3 F (36.8 C) (11/08 0800) Pulse Rate:  [66-83] 76 (11/08 0800) Resp:  [10-18] 16 (11/08 0800) BP: (114-142)/(64-80) 128/76 (11/08 0800) SpO2:  [91 %-99 %] 97 % (11/08 0800) Last BM Date : 10/03/22  Intake/Output from previous day: 11/07 0701 - 11/08 0700 In: 1390 [P.O.:240; I.V.:950; IV Piggyback:200] Out: 0  Intake/Output this shift: No intake/output data recorded.  PE: Gen:  Alert, NAD, pleasant Skin: packing removed from incisions made in left axillary and right chest wall, wound is clean with no purulent drainage   Lab Results:  Recent Labs    10/06/22 0806 10/07/22 0311  WBC 8.8 7.2  HGB 14.8 14.3  HCT 42.0 41.4  PLT 169 215   BMET Recent Labs    10/05/22 1847 10/07/22 0311  NA 132* 134*  K 3.3* 3.6  CL 101 104  CO2 20* 23  GLUCOSE 388* 325*  BUN <5* 6  CREATININE 0.66 0.56  CALCIUM 8.4* 8.7*   PT/INR Recent Labs    10/05/22 2003  LABPROT 13.8  INR 1.1   CMP     Component Value Date/Time   NA 134 (L) 10/07/2022 0311   K 3.6 10/07/2022 0311   CL 104 10/07/2022 0311   CO2 23 10/07/2022 0311   GLUCOSE 325 (H) 10/07/2022 0311   BUN 6 10/07/2022 0311   CREATININE 0.56 10/07/2022 0311   CALCIUM 8.7 (L) 10/07/2022 0311   PROT 6.7 10/05/2022 1847   ALBUMIN 3.1 (L) 10/05/2022 1847   AST 29 10/05/2022 1847   ALT 31 10/05/2022 1847   ALKPHOS 115 10/05/2022 1847   BILITOT 0.7 10/05/2022 1847   GFRNONAA >60 10/07/2022 0311   GFRAA >60 07/26/2020 1458   Lipase     Component Value Date/Time   LIPASE 36 09/20/2022 1146       Studies/Results: No results found.  Anti-infectives: Anti-infectives (From admission, onward)    Start     Dose/Rate Route Frequency Ordered  Stop   10/07/22 1600  metroNIDAZOLE (FLAGYL) IVPB 500 mg        500 mg 100 mL/hr over 60 Minutes Intravenous Every 12 hours 10/07/22 1452     10/07/22 0800  ceFEPIme (MAXIPIME) 2 g in sodium chloride 0.9 % 100 mL IVPB        2 g 200 mL/hr over 30 Minutes Intravenous Every 8 hours 10/07/22 0123     10/06/22 1800  vancomycin (VANCOREADY) IVPB 1500 mg/300 mL  Status:  Discontinued        1,500 mg 150 mL/hr over 120 Minutes Intravenous Every 12 hours 10/06/22 0538 10/06/22 0826   10/06/22 1800  vancomycin (VANCOCIN) IVPB 1000 mg/200 mL premix        1,000 mg 200 mL/hr over 60 Minutes Intravenous Every 12 hours 10/06/22 0826     10/06/22 1000  ceFEPIme (MAXIPIME) 2 g in sodium chloride 0.9 % 100 mL IVPB  Status:  Discontinued        2 g 200 mL/hr over 30 Minutes Intravenous Every 12 hours 10/06/22 0839 10/06/22 0856   10/06/22 1000  ceFEPIme (MAXIPIME) 2 g in sodium chloride 0.9 % 100 mL IVPB  Status:  Discontinued        2 g 200  mL/hr over 30 Minutes Intravenous Every 8 hours 10/06/22 0856 10/07/22 0123   10/06/22 0900  metroNIDAZOLE (FLAGYL) IVPB 500 mg  Status:  Discontinued        500 mg 100 mL/hr over 60 Minutes Intravenous Every 12 hours 10/06/22 0856 10/06/22 1717   10/06/22 0845  metroNIDAZOLE (FLAGYL) IVPB 500 mg  Status:  Discontinued        500 mg 100 mL/hr over 60 Minutes Intravenous  Once 10/06/22 0837 10/06/22 0856   10/06/22 0430  vancomycin (VANCOCIN) IVPB 1000 mg/200 mL premix        1,000 mg 200 mL/hr over 60 Minutes Intravenous Every 1 hr x 2 10/06/22 0427 10/06/22 0837   10/05/22 1845  ceFEPIme (MAXIPIME) 2 g in sodium chloride 0.9 % 100 mL IVPB        2 g 200 mL/hr over 30 Minutes Intravenous  Once 10/05/22 1836 10/05/22 2015        Assessment/Plan  Hidradenitis suppurativa, right chest wall chronic abscess, left axillary abscess  POD 1: I&D of right chest wall chronic abscess and left axillary abscess   ID - metronidazole and cefepime  FEN - regular diet   VTE - lovenox  Foley - none  Plan: packing removed, no further packing needed, dry dressings only no further antibiotics needed, pt.. surgically stable for discharge, out patient appointment being arranged   I reviewed last 24 h vitals and pain scores, last 48 h intake and output, last 24 h labs and trends, and last 24 h imaging results.    LOS: 2 days    Randel Books, PA-S [facility] 10/08/2022, 8:11 AM   Return to office for follow up

## 2022-10-08 NOTE — Progress Notes (Signed)
DISCHARGE NOTE HOME Breanna Norton to be discharged Home per MD order. Discussed prescriptions and follow up appointments with the patient. Prescriptions given to patient; medication list explained in detail. Patient verbalized understanding.  Skin clean, dry and intact without evidence of skin break down, no evidence of skin tears noted. IV catheter discontinued intact. Site without signs and symptoms of complications. Dressing and pressure applied. Pt denies pain at the site currently. No complaints noted.  Patient free of lines, drains, and wounds.   An After Visit Summary (AVS) was printed and given to the patient. Patient escorted via wheelchair, and discharged home via private auto.  Margarita Grizzle, RN

## 2022-10-08 NOTE — TOC Benefit Eligibility Note (Signed)
Patient Product/process development scientist completed.    The patient is currently admitted and upon discharge could be taking Lantus Pens.  The current 30 day co-pay is $0.00.   The patient is insured through [employer]     Roland Earl, CPhT Pharmacy Patient Advocate Specialist [facility] Patient Advocate Team Direct Number: [phone]  Fax: [phone]

## 2022-10-08 NOTE — Telephone Encounter (Signed)
Pharmacy Patient Advocate Encounter  Insurance verification completed.    The patient is insured through [employer]   The patient is currently admitted and ran test claims for the following: Lantus.  Copays and coinsurance results were relayed to Inpatient clinical team.

## 2022-10-08 NOTE — Discharge Instructions (Signed)
Shower with wound open No packing needed Dry bandage over wounds and change daily or as needed

## 2022-10-08 NOTE — Inpatient Diabetes Management (Signed)
Inpatient Diabetes Program Recommendations  AACE/ADA: New Consensus Statement on Inpatient Glycemic Control (2015)  Target Ranges:  Prepandial:   less than 140 mg/dL      Peak postprandial:   less than 180 mg/dL (1-2 hours)      Critically ill patients:  140 - 180 mg/dL   Lab Results  Component Value Date   GLUCAP 230 (H) 10/08/2022   HGBA1C 10.5 (H) 10/06/2022    Review of Glycemic Control  Diabetes history: DM 2 Outpatient Diabetes medications: Trulicity switched to Ozempic, but has been off of it for a few months. Current orders for Inpatient glycemic control:  Semglee 30 units Novolog 0-20 units tid + hs  A1c 10.5%  Spoke with pt at bedside regarding A1c of 10.5% and glucose control at home. Pt reports being on Ozempic most recently in the past with lethargy and diarrhea side effects. Pt has been without medication for awhile without PCP follow up. Pt reports having glucose trends out of control. She also mentions she has a [employer] 2 for glucose monitoring at home with plenty of supplies. Spoke with pt about finding a new PCP for insulin titration and follow up. Pt reports using insulin in the past. Discussed lifestyle modifications and importance of glucose control at home.   D/c needs: Lantus solostar insulin pens Insulin pen needles order # [id number]  Thanks,  Christena Deem RN, MSN, BC-ADM Inpatient Diabetes Coordinator Team Pager [phone] (8a-5p)

## 2022-10-10 LAB — CULTURE, BLOOD (ROUTINE X 2)
Culture: NO GROWTH
Culture: NO GROWTH
Special Requests: ADEQUATE
Special Requests: ADEQUATE

## 2023-11-10 ENCOUNTER — Encounter (HOSPITAL_BASED_OUTPATIENT_CLINIC_OR_DEPARTMENT_OTHER): Payer: Self-pay | Admitting: Emergency Medicine

## 2023-11-10 ENCOUNTER — Other Ambulatory Visit: Payer: Self-pay

## 2023-11-10 ENCOUNTER — Emergency Department (HOSPITAL_BASED_OUTPATIENT_CLINIC_OR_DEPARTMENT_OTHER): Payer: Medicaid Other

## 2023-11-10 ENCOUNTER — Emergency Department (HOSPITAL_BASED_OUTPATIENT_CLINIC_OR_DEPARTMENT_OTHER)
Admission: EM | Admit: 2023-11-10 | Discharge: 2023-11-10 | Disposition: A | Discharge status: Home / Self Care | Payer: Medicaid Other | Attending: Emergency Medicine | Admitting: Emergency Medicine

## 2023-11-10 DIAGNOSIS — E119 Type 2 diabetes mellitus without complications: Secondary | ICD-10-CM | POA: Insufficient documentation

## 2023-11-10 DIAGNOSIS — R109 Unspecified abdominal pain: Secondary | ICD-10-CM

## 2023-11-10 DIAGNOSIS — L02411 Cutaneous abscess of right axilla: Secondary | ICD-10-CM | POA: Diagnosis not present

## 2023-11-10 DIAGNOSIS — Z794 Long term (current) use of insulin: Secondary | ICD-10-CM | POA: Insufficient documentation

## 2023-11-10 DIAGNOSIS — R1030 Lower abdominal pain, unspecified: Secondary | ICD-10-CM | POA: Insufficient documentation

## 2023-11-10 LAB — COMPREHENSIVE METABOLIC PANEL
ALT: 25 U/L (ref 0–44)
AST: 26 U/L (ref 15–41)
Albumin: 3.5 g/dL (ref 3.5–5.0)
Alkaline Phosphatase: 97 U/L (ref 38–126)
Anion gap: 7 (ref 5–15)
BUN: 11 mg/dL (ref 6–20)
CO2: 22 mmol/L (ref 22–32)
Calcium: 9.1 mg/dL (ref 8.9–10.3)
Chloride: 105 mmol/L (ref 98–111)
Creatinine, Ser: 0.7 mg/dL (ref 0.44–1.00)
GFR, Estimated: >60 mL/min (ref >60)
Glucose, Bld: 170 mg/dL — ABNORMAL HIGH (ref 70–99)
Potassium: 3.5 mmol/L (ref 3.5–5.1)
Sodium: 134 mmol/L — ABNORMAL LOW (ref 135–145)
Total Bilirubin: 0.6 mg/dL (ref <1.2)
Total Protein: 7.7 g/dL (ref 6.5–8.1)

## 2023-11-10 LAB — URINALYSIS, ROUTINE W REFLEX MICROSCOPIC
Bilirubin Urine: NEGATIVE
Glucose, UA: 100 mg/dL — AB
Hgb urine dipstick: NEGATIVE
Ketones, ur: NEGATIVE mg/dL
Leukocytes,Ua: NEGATIVE
Nitrite: NEGATIVE
Protein, ur: NEGATIVE mg/dL
Specific Gravity, Urine: 1.015 (ref 1.005–1.030)
pH: 6.5 (ref 5.0–8.0)

## 2023-11-10 LAB — CBC WITH DIFFERENTIAL/PLATELET
Abs Immature Granulocytes: 0.04 10*3/uL (ref 0.00–0.07)
Basophils Absolute: 0.1 10*3/uL (ref 0.0–0.1)
Basophils Relative: 1 %
Eosinophils Absolute: 0.2 10*3/uL (ref 0.0–0.5)
Eosinophils Relative: 2 %
HCT: 44.2 % (ref 36.0–46.0)
Hemoglobin: 15.4 g/dL — ABNORMAL HIGH (ref 12.0–15.0)
Immature Granulocytes: 0 %
Lymphocytes Relative: 33 %
Lymphs Abs: 4.3 10*3/uL — ABNORMAL HIGH (ref 0.7–4.0)
MCH: 35.5 pg — ABNORMAL HIGH (ref 26.0–34.0)
MCHC: 34.8 g/dL (ref 30.0–36.0)
MCV: 101.8 fL — ABNORMAL HIGH (ref 80.0–100.0)
Monocytes Absolute: 0.9 10*3/uL (ref 0.1–1.0)
Monocytes Relative: 7 %
Neutro Abs: 7.6 10*3/uL (ref 1.7–7.7)
Neutrophils Relative %: 57 %
Platelets: 299 10*3/uL (ref 150–400)
RBC: 4.34 MIL/uL (ref 3.87–5.11)
RDW: 13.2 % (ref 11.5–15.5)
WBC: 13.1 10*3/uL — ABNORMAL HIGH (ref 4.0–10.5)
nRBC: 0 % (ref 0.0–0.2)

## 2023-11-10 LAB — LACTIC ACID, PLASMA: Lactic Acid, Venous: 1.7 mmol/L (ref 0.5–1.9)

## 2023-11-10 MED ORDER — SULFAMETHOXAZOLE-TRIMETHOPRIM 800-160 MG PO TABS: 1.0000 | ORAL_TABLET | Freq: Two times a day (BID) | ORAL | 0 refills | Status: AC

## 2023-11-10 MED ORDER — SODIUM CHLORIDE 0.9 % IV SOLN
1.0000 g | Freq: Once | INTRAVENOUS | Status: AC
  Administered 2023-11-10: 1 g via INTRAVENOUS
  Filled 2023-11-10: qty 10

## 2023-11-10 MED ORDER — LIDOCAINE-EPINEPHRINE (PF) 2 %-1:200000 IJ SOLN
10.0000 mL | Freq: Once | INTRAMUSCULAR | Status: AC
  Administered 2023-11-10: 10 mL via INTRADERMAL
  Filled 2023-11-10: qty 20

## 2023-11-10 MED ORDER — SODIUM CHLORIDE 0.9 % IV BOLUS
1000.0000 mL | Freq: Once | INTRAVENOUS | Status: AC
  Administered 2023-11-10: 1000 mL via INTRAVENOUS

## 2023-11-10 NOTE — ED Notes (Signed)
Unable to obtain blood with IV stick. NT at bedside attempting to obtain blood work.

## 2023-11-10 NOTE — ED Triage Notes (Signed)
Pt presents to the ED via POV with complaints of bilateral flank pain x 4 days. The patient was diagnosed with a kidney infection on Friday. She is currently on bactrim and has one dose left to complete. Of note, the patient also mentioned 2 abscess on her R axilla that she would also like to have evaluated. A&Ox4 at this time. Denies fevers, chills, CP or SOB.

## 2023-11-10 NOTE — ED Notes (Signed)
Patient transported to CT via wheelchair

## 2023-11-10 NOTE — ED Notes (Signed)
Nurse tech unable to obtain blood work. CN at bedside attempting to obtain blood work.

## 2023-11-10 NOTE — ED Provider Notes (Signed)
[place] [facility] Provider Note   CSN: [id number] Arrival date & time: 11/10/23  4098     History  Chief Complaint  Patient presents with   Flank Pain    Breanna Norton is a 47 y.o. female.  Patient to ED with bilateral flank pain, generalized weakness persistent for the past week. Seen 12/5 and started on Bactrim for UTI with history of recurrent kidney infections. She reports she is no better and feels like she is worse. No vomiting. She also reports a history of hidradenitis suppurativa with 2 abscesses in her right axilla, one open and draining. She denies fever. No diarrhea. She reports stool incontinence during the last 2 nights, which she has had before when she has an infection.   The history is provided by the patient. No language interpreter was used.  Flank Pain       Home Medications Prior to Admission medications   Medication Sig Start Date End Date Taking? Authorizing Provider  sulfamethoxazole-trimethoprim (BACTRIM DS) 800-160 MG tablet Take 1 tablet by mouth 2 (two) times daily for 5 days. 11/10/23 11/15/23 Yes Korby Ratay, Melvenia Beam, PA-C  acetaminophen (TYLENOL) 500 MG tablet Take 2 tablets (1,000 mg total) by mouth every 6 (six) hours as needed for mild pain, fever or headache. 10/08/22   Barnetta Chapel, PA-C  diclofenac Sodium (VOLTAREN) 1 % GEL Apply 2 g topically every 6 (six) hours as needed (pain).    [provider]  escitalopram (LEXAPRO) 20 MG tablet Take 20 mg by mouth daily.    [provider]  ibuprofen (ADVIL) 200 MG tablet Take 800 mg by mouth every 6 (six) hours as needed for moderate pain.    [provider]  insulin glargine (LANTUS) 100 UNIT/ML Solostar Pen Inject 30 Units into the skin daily. 10/08/22   Glade Lloyd, MD  Insulin Pen Needle 32G X 4 MM MISC Lantus 30 units subcutaneous daily 10/08/22   Glade Lloyd, MD      Allergies    Keflex [cephalexin], Metformin, Morphine and  codeine, and Penicillins    Review of Systems   Review of Systems  Genitourinary:  Positive for flank pain.    Physical Exam Updated Vital Signs BP 103/75   Pulse 89   Temp 98.8 F (37.1 C)   Resp 20   Ht 5' 5.5" (1.664 m)   Wt 130.6 kg   SpO2 96%   BMI 47.20 kg/m  Physical Exam Vitals and nursing note reviewed.  Constitutional:      Appearance: Normal appearance.  HENT:     Mouth/Throat:     Mouth: Mucous membranes are moist.  Cardiovascular:     Rate and Rhythm: Normal rate and regular rhythm.  Pulmonary:     Effort: Pulmonary effort is normal.  Abdominal:     Palpations: Abdomen is soft.     Tenderness: There is abdominal tenderness (Suprapubic.). There is right CVA tenderness and left CVA tenderness.  Musculoskeletal:        General: Normal range of motion.     Cervical back: Normal range of motion and neck supple.  Skin:    General: Skin is warm and dry.     Comments: 3 cm x 2 cm fluctuant abscess x1 right axilla.   Neurological:     Mental Status: She is alert and oriented to person, place, and time.     ED Results / Procedures / Treatments   Labs (all labs ordered are listed, but  only abnormal results are displayed) Labs Reviewed  URINALYSIS, ROUTINE W REFLEX MICROSCOPIC - Abnormal; Notable for the following components:      Result Value   APPearance HAZY (*)    Glucose, UA 100 (*)    All other components within normal limits  CBC WITH DIFFERENTIAL/PLATELET - Abnormal; Notable for the following components:   WBC 13.1 (*)    Hemoglobin 15.4 (*)    MCV 101.8 (*)    MCH 35.5 (*)    Lymphs Abs 4.3 (*)    All other components within normal limits  COMPREHENSIVE METABOLIC PANEL - Abnormal; Notable for the following components:   Sodium 134 (*)    Glucose, Bld 170 (*)    All other components within normal limits  LACTIC ACID, PLASMA  LACTIC ACID, PLASMA   Results for orders placed or performed during the hospital encounter of 11/10/23  Urinalysis,  Routine w reflex microscopic -Urine, Clean Catch  Result Value Ref Range   Color, Urine YELLOW YELLOW   APPearance HAZY (A) CLEAR   Specific Gravity, Urine 1.015 1.005 - 1.030   pH 6.5 5.0 - 8.0   Glucose, UA 100 (A) NEGATIVE mg/dL   Hgb urine dipstick NEGATIVE NEGATIVE   Bilirubin Urine NEGATIVE NEGATIVE   Ketones, ur NEGATIVE NEGATIVE mg/dL   Protein, ur NEGATIVE NEGATIVE mg/dL   Nitrite NEGATIVE NEGATIVE   Leukocytes,Ua NEGATIVE NEGATIVE  CBC with Differential  Result Value Ref Range   WBC 13.1 (H) 4.0 - 10.5 K/uL   RBC 4.34 3.87 - 5.11 MIL/uL   Hemoglobin 15.4 (H) 12.0 - 15.0 g/dL   HCT 16.1 09.6 - 04.5 %   MCV 101.8 (H) 80.0 - 100.0 fL   MCH 35.5 (H) 26.0 - 34.0 pg   MCHC 34.8 30.0 - 36.0 g/dL   RDW 40.9 81.1 - 91.4 %   Platelets 299 150 - 400 K/uL   nRBC 0.0 0.0 - 0.2 %   Neutrophils Relative % 57 %   Neutro Abs 7.6 1.7 - 7.7 K/uL   Lymphocytes Relative 33 %   Lymphs Abs 4.3 (H) 0.7 - 4.0 K/uL   Monocytes Relative 7 %   Monocytes Absolute 0.9 0.1 - 1.0 K/uL   Eosinophils Relative 2 %   Eosinophils Absolute 0.2 0.0 - 0.5 K/uL   Basophils Relative 1 %   Basophils Absolute 0.1 0.0 - 0.1 K/uL   Immature Granulocytes 0 %   Abs Immature Granulocytes 0.04 0.00 - 0.07 K/uL  Comprehensive metabolic panel  Result Value Ref Range   Sodium 134 (L) 135 - 145 mmol/L   Potassium 3.5 3.5 - 5.1 mmol/L   Chloride 105 98 - 111 mmol/L   CO2 22 22 - 32 mmol/L   Glucose, Bld 170 (H) 70 - 99 mg/dL   BUN 11 6 - 20 mg/dL   Creatinine, Ser 7.82 0.44 - 1.00 mg/dL   Calcium 9.1 8.9 - 95.6 mg/dL   Total Protein 7.7 6.5 - 8.1 g/dL   Albumin 3.5 3.5 - 5.0 g/dL   AST 26 15 - 41 U/L   ALT 25 0 - 44 U/L   Alkaline Phosphatase 97 38 - 126 U/L   Total Bilirubin 0.6 <1.2 mg/dL   GFR, Estimated >21 >30 mL/min   Anion gap 7 5 - 15  Lactic acid, plasma  Result Value Ref Range   Lactic Acid, Venous 1.7 0.5 - 1.9 mmol/L     EKG None  Radiology CT Renal Stone Study  Result  Date:  11/10/2023 CLINICAL DATA:  Bilateral flank pain for 4 days, urinary tract infection EXAM: CT ABDOMEN AND PELVIS WITHOUT CONTRAST TECHNIQUE: Multidetector CT imaging of the abdomen and pelvis was performed following the standard protocol without IV contrast. RADIATION DOSE REDUCTION: This exam was performed according to the departmental dose-optimization program which includes automated exposure control, adjustment of the mA and/or kV according to patient size and/or use of iterative reconstruction technique. COMPARISON:  04/13/2018, 07/26/2020 FINDINGS: Lower chest: Faint reticular and nodular opacities are seen throughout the lung bases, new since prior chest CT. Etiology is indeterminate, this could reflect hypersensitivity pneumonitis or developing chronic interstitial lung disease. Nonemergent outpatient high-resolution chest CT may be useful for follow-up. No airspace disease or effusion. Hepatobiliary: Heterogeneous decreased attenuation of the liver parenchyma most consistent with hepatic steatosis. Evaluation of the liver parenchyma is limited without IV contrast. Prior cholecystectomy. No biliary duct dilation. Pancreas: Unremarkable unenhanced appearance. Spleen: Unremarkable unenhanced appearance. Adrenals/Urinary Tract: There are punctate less than 2 mm nonobstructing bilateral renal calculi. No obstructive uropathy within either kidney. The adrenals and bladder are unremarkable. Stomach/Bowel: No bowel obstruction or ileus. Normal appendix right lower quadrant. No bowel wall thickening or inflammatory change. Vascular/Lymphatic: Aortic atherosclerosis. No enlarged abdominal or pelvic lymph nodes. Reproductive: Status post hysterectomy. No adnexal masses. Other: No free fluid or free intraperitoneal gas. No abdominal wall hernia. Musculoskeletal: No acute or destructive bony abnormalities. Reconstructed images demonstrate no additional findings. IMPRESSION: 1. Punctate bilateral less than 2 mm  nonobstructing renal calculi. 2. Heterogeneous decreased liver attenuation, consistent with hepatic steatosis. 3. Fine reticular and nodular opacities within the lung bases, nonspecific. Appearance is most consistent with inflammatory process such as hypersensitivity pneumonitis, though atypical infection or developing chronic interstitial lung disease could give a similar appearance. Clinical correlation is recommended, and pulmonology follow-up and nonemergent outpatient high-resolution chest CT may be useful for further evaluation. Electronically Signed   By: Sharlet Salina M.D.   On: 11/10/2023 22:46    Procedures .Incision and Drainage  Date/Time: 11/10/2023 11:25 PM  Performed by: Elpidio Anis, PA-C Authorized by: Elpidio Anis, PA-C   Consent:    Consent obtained:  Verbal Universal protocol:    Procedure explained and questions answered to patient or proxy's satisfaction: yes     Patient identity confirmed:  Verbally with patient Location:    Type:  Abscess   Location:  Upper extremity   Upper extremity location: right axilla. Pre-procedure details:    Skin preparation:  Povidone-iodine Anesthesia:    Anesthesia method:  Local infiltration   Local anesthetic:  Lidocaine 2% WITH epi Procedure type:    Complexity:  Simple Procedure details:    Incision types:  Single straight   Wound management:  Probed and deloculated and irrigated with saline   Drainage:  Bloody and purulent   Drainage amount:  Moderate   Wound treatment:  Wound left open Post-procedure details:    Procedure completion:  Tolerated well, no immediate complications     Medications Ordered in ED Medications  sodium chloride 0.9 % bolus 1,000 mL (0 mLs Intravenous Stopped 11/10/23 2330)  cefTRIAXone (ROCEPHIN) 1 g in sodium chloride 0.9 % 100 mL IVPB (0 g Intravenous Stopped 11/10/23 2205)  lidocaine-EPINEPHrine (XYLOCAINE W/EPI) 2 %-1:200000 (PF) injection 10 mL (10 mLs Intradermal Given by Other  11/10/23 2300)    ED Course/ Medical Decision Making/ A&P  Medical Decision Making This patient presents to the ED for concern of  1) bilateral flank pain 2) axillary abscess these involve an extensive number of treatment options, and is a complaint that carries with it a high risk of complications and morbidity.  The differential diagnosis includes  1) kidney stones; pyelonephritis; sepsis; UTI 2) h. Suppuritiva;    Co morbidities that complicate the patient evaluation  Poorly controlled T2DM; hidradenitis supp.    Additional history obtained:  Additional history and/or information obtained from chart review, notable for Visit 12/5 reviewed. Seen for bilateral flank pain, RUQ and LUQ abdominal pain. UA reviewed and showed some leukocyte esterase, bacteria. Noted to be hypotensive during that visit.    Lab Tests:  I Ordered, and personally interpreted labs.  The pertinent results include:  normal renal function, no electrolyte abnormalities; WBC 13.1, hgb 15.4    Imaging Studies ordered:  I ordered imaging studies including CT renal I independently visualized and interpreted imaging which showed Punctate rental stones, nonobstructing I agree with the radiologist interpretation   Test Considered:  Lactic acid added to CBC, Cmet, UA with patient reporting history of sepsis requiring admission with similar symptoms  BP 116/75, normal HR - will provide fluids as she reports decreased PO intake and generalized weakness   Problem List / ED Course:  Patient with concern for developing sepsis as she is not getting better with abx prescribed 5 days ago by PCP for UTI.  She reports urine culture positive for Klebsiella. Should be sensitive to Bactrim. Will continue for another 5 days. CT renal negative for ureteral stones as well as perinephric stranding that would suggest pyelo.  I&D performed on right axillary abscess Lactic acid normal at 1.7  - no evidence sepsis.     Social Determinants of Health:  Supportive family/spouse   Disposition:  After consideration of the diagnostic results and the patients response to treatment, I feel that the patient would benefit from She is felt appropriate for discharge home. Will continue Bactrim for another 5 days. Patient with elevated A1c which may interfere with improvement in condition. Strongly encouraged close follow up with PCP.    Amount and/or Complexity of Data Reviewed Labs: ordered. Radiology: ordered.  Risk Prescription drug management.           Final Clinical Impression(s) / ED Diagnoses Final diagnoses:  Flank pain  Abscess of right axilla    Rx / DC Orders ED Discharge Orders          Ordered    sulfamethoxazole-trimethoprim (BACTRIM DS) 800-160 MG tablet  2 times daily        11/10/23 2329              Elpidio Anis, PA-C 11/10/23 2330    Tegeler, Canary Brim, MD 11/10/23 2340

## 2023-11-10 NOTE — ED Notes (Signed)
Provider at bedside performing I &D

## 2023-11-10 NOTE — Discharge Instructions (Signed)
Follow up with your doctor for recheck this week. You have been prescribed another 5 days of Bactrim.

## 2024-05-08 ENCOUNTER — Encounter (HOSPITAL_BASED_OUTPATIENT_CLINIC_OR_DEPARTMENT_OTHER): Payer: Self-pay

## 2024-05-08 ENCOUNTER — Other Ambulatory Visit: Payer: Self-pay

## 2024-05-08 ENCOUNTER — Emergency Department (HOSPITAL_BASED_OUTPATIENT_CLINIC_OR_DEPARTMENT_OTHER)

## 2024-05-08 ENCOUNTER — Emergency Department (HOSPITAL_BASED_OUTPATIENT_CLINIC_OR_DEPARTMENT_OTHER)
Admission: EM | Admit: 2024-05-08 | Discharge: 2024-05-09 | Disposition: A | Discharge status: Home / Self Care | Attending: Emergency Medicine | Admitting: Emergency Medicine

## 2024-05-08 DIAGNOSIS — E119 Type 2 diabetes mellitus without complications: Secondary | ICD-10-CM | POA: Insufficient documentation

## 2024-05-08 DIAGNOSIS — L03311 Cellulitis of abdominal wall: Secondary | ICD-10-CM | POA: Insufficient documentation

## 2024-05-08 DIAGNOSIS — Z794 Long term (current) use of insulin: Secondary | ICD-10-CM | POA: Diagnosis not present

## 2024-05-08 LAB — CBC WITH DIFFERENTIAL/PLATELET
Abs Immature Granulocytes: 0.05 10*3/uL (ref 0.00–0.07)
Basophils Absolute: 0.1 10*3/uL (ref 0.0–0.1)
Basophils Relative: 1 %
Eosinophils Absolute: 0.2 10*3/uL (ref 0.0–0.5)
Eosinophils Relative: 2 %
HCT: 43.5 % (ref 36.0–46.0)
Hemoglobin: 15.1 g/dL — ABNORMAL HIGH (ref 12.0–15.0)
Immature Granulocytes: 0 %
Lymphocytes Relative: 29 %
Lymphs Abs: 3.5 10*3/uL (ref 0.7–4.0)
MCH: 33.9 pg (ref 26.0–34.0)
MCHC: 34.7 g/dL (ref 30.0–36.0)
MCV: 97.5 fL (ref 80.0–100.0)
Monocytes Absolute: 0.9 10*3/uL (ref 0.1–1.0)
Monocytes Relative: 7 %
Neutro Abs: 7.6 10*3/uL (ref 1.7–7.7)
Neutrophils Relative %: 61 %
Platelets: 274 10*3/uL (ref 150–400)
RBC: 4.46 MIL/uL (ref 3.87–5.11)
RDW: 12.2 % (ref 11.5–15.5)
WBC: 12.4 10*3/uL — ABNORMAL HIGH (ref 4.0–10.5)
nRBC: 0 % (ref 0.0–0.2)

## 2024-05-08 LAB — COMPREHENSIVE METABOLIC PANEL WITH GFR
ALT: 41 U/L (ref 0–44)
AST: 36 U/L (ref 15–41)
Albumin: 3.8 g/dL (ref 3.5–5.0)
Alkaline Phosphatase: 165 U/L — ABNORMAL HIGH (ref 38–126)
Anion gap: 14 (ref 5–15)
BUN: 8 mg/dL (ref 6–20)
CO2: 21 mmol/L — ABNORMAL LOW (ref 22–32)
Calcium: 9.1 mg/dL (ref 8.9–10.3)
Chloride: 103 mmol/L (ref 98–111)
Creatinine, Ser: 0.55 mg/dL (ref 0.44–1.00)
GFR, Estimated: >60 mL/min (ref >60)
Glucose, Bld: 289 mg/dL — ABNORMAL HIGH (ref 70–99)
Potassium: 3.8 mmol/L (ref 3.5–5.1)
Sodium: 137 mmol/L (ref 135–145)
Total Bilirubin: 0.3 mg/dL (ref 0.0–1.2)
Total Protein: 7.5 g/dL (ref 6.5–8.1)

## 2024-05-08 LAB — LACTIC ACID, PLASMA
Lactic Acid, Venous: 2.5 mmol/L (ref 0.5–1.9)
Lactic Acid, Venous: 1.2 mmol/L (ref 0.5–1.9)

## 2024-05-08 MED ORDER — SODIUM CHLORIDE 0.9 % IV SOLN
2.0000 g | Freq: Once | INTRAVENOUS | Status: AC
  Administered 2024-05-08: 2 g via INTRAVENOUS
  Filled 2024-05-08: qty 12.5

## 2024-05-08 MED ORDER — SULFAMETHOXAZOLE-TRIMETHOPRIM 800-160 MG PO TABS: 1.0000 | ORAL_TABLET | Freq: Two times a day (BID) | ORAL | 0 refills | Status: AC

## 2024-05-08 MED ORDER — IOHEXOL 300 MG/ML  SOLN
100.0000 mL | Freq: Once | INTRAMUSCULAR | Status: AC | PRN
  Administered 2024-05-08: 100 mL via INTRAVENOUS

## 2024-05-08 MED ORDER — SODIUM CHLORIDE 0.9 % IV BOLUS
1000.0000 mL | Freq: Once | INTRAVENOUS | Status: AC
  Administered 2024-05-08: 1000 mL via INTRAVENOUS

## 2024-05-08 MED ORDER — SODIUM CHLORIDE 0.9 % IV SOLN: INTRAVENOUS | Status: DC | PRN

## 2024-05-08 MED ORDER — VANCOMYCIN HCL IN DEXTROSE 1-5 GM/200ML-% IV SOLN
1000.0000 mg | Freq: Once | INTRAVENOUS | Status: AC
  Administered 2024-05-08: 1000 mg via INTRAVENOUS
  Filled 2024-05-08: qty 200

## 2024-05-08 MED ORDER — HYDROCODONE-ACETAMINOPHEN 5-325 MG PO TABS: 1.0000 | ORAL_TABLET | Freq: Four times a day (QID) | ORAL | 0 refills | Status: AC | PRN

## 2024-05-08 MED ORDER — DOXYCYCLINE HYCLATE 100 MG PO CAPS: 100.0000 mg | ORAL_CAPSULE | Freq: Two times a day (BID) | ORAL | 0 refills | Status: AC

## 2024-05-08 MED ORDER — FENTANYL CITRATE PF 50 MCG/ML IJ SOSY
50.0000 ug | PREFILLED_SYRINGE | Freq: Once | INTRAMUSCULAR | Status: AC
  Administered 2024-05-08: 50 ug via INTRAVENOUS
  Filled 2024-05-08: qty 1

## 2024-05-08 NOTE — ED Triage Notes (Signed)
 POV, Abscess on left flank for a week, tender to palpate, pain 10 out of 10 when palpated. Pt report chills at home.  Alert and oriented at triage

## 2024-05-08 NOTE — Progress Notes (Signed)
 ED Pharmacy Antibiotic Sign Off An antibiotic consult was received from an ED provider for cefepime  per pharmacy dosing for wound infection. A chart review was completed to assess appropriateness.  A single dose of vancomycin  placed by the ED provider.   The following one time order(s) were placed per pharmacy consult:  cefepime  2000 mg x 1 dose - Keflex allergy in chart but patient has documented history of tolerating cefepime   Further antibiotic and/or antibiotic pharmacy consults should be ordered by the admitting provider if indicated.   Thank you for allowing pharmacy to be a part of this patient's care.   Dionicio Fray, PharmD, BCPS 05/08/2024 8:45 PM ED Clinical Pharmacist -  [phone]

## 2024-05-08 NOTE — ED Notes (Signed)
 Provider aware of lactic 2.5.

## 2024-05-08 NOTE — Discharge Instructions (Addendum)
 As we discussed, your CT scan did not show any deep abscess.   I have prescribed doxycycline  and Bactrim  for a week  I have also prescribed Vicodin for severe pain  See your doctor for follow-up  Return to ER if you have worse pain or fever or vomiting

## 2024-05-08 NOTE — ED Provider Notes (Signed)
 [place] [facility] Provider Note   CSN: [id number] Arrival date & time: 05/08/24  1943     History  Chief Complaint  Patient presents with   Abscess    Breanna Norton is a 48 y.o. female history of diabetes, recurrent skin infection here presenting with possible abscess.  Patient states that she noticed an area of swelling under her pannus of the left side of her abdomen.  She states that it started draining about 3 days ago.  She has some subjective chills but no fever.  She states that she has recurrent abscess that required IV antibiotics and also surgical drainage previously.  She does not know if she has a history of MRSA or not.  Patient states that her blood sugar has been running in the 200s which is higher than usual.  The history is provided by the patient.       Home Medications Prior to Admission medications   Medication Sig Start Date End Date Taking? Authorizing Provider  acetaminophen  (TYLENOL ) 500 MG tablet Take 2 tablets (1,000 mg total) by mouth every 6 (six) hours as needed for mild pain, fever or headache. 10/08/22   Marlin Simmonds, PA-C  diclofenac Sodium (VOLTAREN) 1 % GEL Apply 2 g topically every 6 (six) hours as needed (pain).    [provider]  escitalopram  (LEXAPRO ) 20 MG tablet Take 20 mg by mouth daily.    [provider]  ibuprofen (ADVIL) 200 MG tablet Take 800 mg by mouth every 6 (six) hours as needed for moderate pain.    [provider]  insulin  glargine (LANTUS ) 100 UNIT/ML Solostar Pen Inject 30 Units into the skin daily. 10/08/22   Audria Leather, MD  Insulin  Pen Needle 32G X 4 MM MISC Lantus  30 units subcutaneous daily 10/08/22   Audria Leather, MD      Allergies    Keflex [cephalexin], Metformin, Morphine and codeine, and Penicillins    Review of Systems   Review of Systems  Skin:  Positive for wound.  All other systems reviewed and are negative.   Physical Exam Updated  Vital Signs BP 130/77 (BP Location: Right Arm)   Pulse 99   Temp 98.1 F (36.7 C) (Oral)   Resp 16   SpO2 99%  Physical Exam Vitals and nursing note reviewed.  Constitutional:      Comments: Uncomfortable and chronically ill  HENT:     Head: Normocephalic.     Nose: Nose normal.     Mouth/Throat:     Mouth: Mucous membranes are dry.  Eyes:     Extraocular Movements: Extraocular movements intact.     Pupils: Pupils are equal, round, and reactive to light.  Cardiovascular:     Rate and Rhythm: Regular rhythm. Tachycardia present.     Pulses: Normal pulses.     Heart sounds: Normal heart sounds.  Pulmonary:     Effort: Pulmonary effort is normal.     Breath sounds: Normal breath sounds.  Abdominal:     Comments: Patient has a large pannus and on the left side of the pannus, patient has an area of 10 cm with an area of induration with drainage already  Musculoskeletal:        General: Normal range of motion.     Cervical back: Normal range of motion and neck supple.  Skin:    General: Skin is warm.     Capillary Refill: Capillary refill takes less than 2 seconds.  Neurological:     General: No focal deficit present.     Mental Status: She is oriented to person, place, and time.  Psychiatric:        Mood and Affect: Mood normal.        Behavior: Behavior normal.     ED Results / Procedures / Treatments   Labs (all labs ordered are listed, but only abnormal results are displayed) Labs Reviewed  CBC WITH DIFFERENTIAL/PLATELET - Abnormal; Notable for the following components:      Result Value   WBC 12.4 (*)    Hemoglobin 15.1 (*)    All other components within normal limits  COMPREHENSIVE METABOLIC PANEL WITH GFR - Abnormal; Notable for the following components:   CO2 21 (*)    Glucose, Bld 289 (*)    Alkaline Phosphatase 165 (*)    All other components within normal limits  LACTIC ACID, PLASMA - Abnormal; Notable for the following components:   Lactic Acid, Venous  2.5 (*)    All other components within normal limits  CULTURE, BLOOD (ROUTINE X 2)  CULTURE, BLOOD (ROUTINE X 2)  LACTIC ACID, PLASMA    EKG None  Radiology No results found.  Procedures Procedures    Medications Ordered in ED Medications  vancomycin  (VANCOCIN ) IVPB 1000 mg/200 mL premix (has no administration in time range)  ceFEPIme  (MAXIPIME ) 2 g in sodium chloride  0.9 % 100 mL IVPB (2 g Intravenous New Bag/Given 05/08/24 2119)  iohexol  (OMNIPAQUE ) 300 MG/ML solution 100 mL (has no administration in time range)  0.9 %  sodium chloride  infusion ( Intravenous New Bag/Given 05/08/24 2117)  sodium chloride  0.9 % bolus 1,000 mL (1,000 mLs Intravenous New Bag/Given 05/08/24 2120)  fentaNYL  (SUBLIMAZE ) injection 50 mcg (50 mcg Intravenous Given 05/08/24 2110)    ED Course/ Medical Decision Making/ A&P                                 Medical Decision Making Breanna Norton is a 48 y.o. female here presenting with chills and possible abscess under the left pannus.  Patient has history of recurrent abscess that required surgical drainage.  Patient also history of diabetes.  Will get a CT abdomen pelvis to rule out intra-abdominal extension.  Will do sepsis workup with CBC CMP and lactate and cultures.  Will give Vanco and Zosyn and IV fluids.   10:44 PM I reviewed patient's labs and white blood cell count is elevated at 12.5.  Patient's lactate is elevated at 2.5.  CT abdomen pelvis did not show any intra-abdominal abscess.  Patient received Vanco and cefepime .  Patient had hives with Keflex but has tolerated cefepime  in the past.  I do have some concern for MRSA so I will send patient home with doxycycline  and Bactrim .  I gave strict return precautions  Problems Addressed: Abdominal wall cellulitis: acute illness or injury  Amount and/or Complexity of Data Reviewed Labs: ordered. Decision-making details documented in ED Course. Radiology: ordered and independent interpretation performed.  Decision-making details documented in ED Course.  Risk Prescription drug management.    Final Clinical Impression(s) / ED Diagnoses Final diagnoses:  None    Rx / DC Orders ED Discharge Orders     None         Dalene Duck, MD 05/08/24 2246

## 2024-05-14 LAB — CULTURE, BLOOD (ROUTINE X 2)
Culture: NO GROWTH
Culture: NO GROWTH
